# Patient Record
Sex: Female | Born: 1990 | Race: White | Hispanic: No | Marital: Married
Health system: Midwestern US, Academic
[De-identification: ages and names within clinical notes are randomized; demographics above are authoritative.]

---

## 2021-12-21 ENCOUNTER — Encounter: Admit: 2021-12-21 | Discharge: 2021-12-21 | Payer: BC Managed Care – PPO

## 2021-12-28 ENCOUNTER — Encounter: Admit: 2021-12-28 | Discharge: 2021-12-28 | Payer: BC Managed Care – PPO

## 2021-12-28 ENCOUNTER — Ambulatory Visit: Admit: 2021-12-28 | Discharge: 2021-12-29 | Payer: BC Managed Care – PPO

## 2021-12-28 DIAGNOSIS — G5601 Carpal tunnel syndrome, right upper limb: Secondary | ICD-10-CM

## 2021-12-28 DIAGNOSIS — M25531 Pain in right wrist: Secondary | ICD-10-CM

## 2021-12-28 DIAGNOSIS — E119 Type 2 diabetes mellitus without complications: Secondary | ICD-10-CM

## 2021-12-28 DIAGNOSIS — F32A Depression: Secondary | ICD-10-CM

## 2021-12-28 DIAGNOSIS — G5621 Lesion of ulnar nerve, right upper limb: Secondary | ICD-10-CM

## 2021-12-28 DIAGNOSIS — J189 Pneumonia, unspecified organism: Secondary | ICD-10-CM

## 2021-12-28 DIAGNOSIS — T7840XA Allergy, unspecified, initial encounter: Secondary | ICD-10-CM

## 2021-12-28 DIAGNOSIS — F419 Anxiety disorder, unspecified: Secondary | ICD-10-CM

## 2021-12-28 MED ORDER — TRIAMCINOLONE ACETONIDE 40 MG/ML IJ SUSP
40 mg | Freq: Once | INTRAMUSCULAR | 0 refills | Status: CP
Start: 2021-12-28 — End: ?
  Administered 2021-12-28: 14:00:00 40 mg via INTRAMUSCULAR

## 2021-12-28 MED ORDER — LIDOCAINE-EPINEPHRINE 1 %-1:100,000 IJ SOLN
1 mL | Freq: Once | 0 refills | Status: CP
Start: 2021-12-28 — End: ?
  Administered 2021-12-28: 14:00:00 1 mL

## 2021-12-28 NOTE — Progress Notes
Subjective:       History of Present Illness  Renee Christensen is a 31 y.o. female, right hand dominant, who presents for evaluation of right ulnar-sided wrist pain. She denies any specific injury but notes that it seems to be associated with repetitive movemetns.  She was initially seen and had X-rays, which showed no bony abnormality. She subsequently had an MRI of the wrist, which showed Small perforation of the SL ligament and the central component, mild thickening of the ECRB and ECRL, volar ganglion cyst. and was referred for evaluation and treatment.    She has not had a cortisone injection to her ulnar wrist. She has not had occupational therapy.    The pain is sharp especially with rotation of the wrist in supination and when she pushes down on the wrist. There is numbness or tingling of the entire hand sometimes the small and ring fingers.  She reports that sometimes the symptoms wake her from her sleep. She does not feel any gross instability of the wrist.     She denies prior injuries to that wrist or hand.  She reports she had surgery on her right wrist for a first dorsal compartment release which generally helped but has left her with some numbness over her dorsal thumb and a scar which when bumped causes significant pain.    Occupation: works at [employer]       Review of Systems   Constitutional: Negative.    HENT: Negative.    Eyes: Negative.    Respiratory: Negative.    Cardiovascular: Negative.    Gastrointestinal: Negative.    Endocrine: Negative.    Genitourinary: Negative.    Musculoskeletal: Negative.    Skin: Negative.    Allergic/Immunologic: Negative.    Neurological: Negative.    Hematological: Negative.    Psychiatric/Behavioral: Negative.          Objective:         ? atorvastatin (LIPITOR) 80 mg tablet Take one tablet by mouth.   ? cetirizine (ZYRTEC) 10 mg tablet Take one tablet by mouth.   ? citalopram (CELEXA) 20 mg tablet    ? insulin aspart U-100 (NOVOLOG) 100 unit/mL injection Insulin pump; Medtronic 670G: 12A-2.2,7a-2, 3- 2.20, 9p-2.00 I:C 12a-6.2,5a-6.2,11a-6.2,5p-6.2, 9p-6.2 SF 30 Target 120 AIT 2hrs TDD 75 U   ? lisinopril (ZESTRIL) 5 mg tablet Take one tablet by mouth.   ? metFORMIN (GLUCOPHAGE) 500 mg tablet Take one tablet by mouth.   ? SUMAtriptan succinate (IMITREX) 100 mg tablet      Vitals:    12/28/21 0850   BP: (!) 141/71   Pulse: 79   Temp: 36.4 ?C (97.6 ?F)   SpO2: 100%   TempSrc: Oral   PainSc: Six   Weight: 83.9 kg (185 lb)   Height: 162.6 cm (5' 4)     Body mass index is 31.76 kg/m?Marland Kitchen     Physical Exam  Constitutional: Well developed, well nourished, in no acute distress  Psychological: normal affect, mood   HEENT: normocephalic, anicteric  Neck: supple, normal ROM   Respiratory: Normal effort, no respiratory distress, no cyanosis  Cardiovascular: visible extremities are warm and well perfused   RIGHT UPPER EXTREMITY:   Skin: intact, normal color and temperature   Observation: minor edema of left dorsal wrist. No frank ECU subluxation, but clicking of the wrist that resolves when stabilizing the ECU.  Tenderness:    Snuffbox: Negative  on right, negative on left    Scapholunate interval:  Negative on right, negative on left    Lunotriquetral interval: Negative on right, negative on left    ECU: Positive on right, negative on left    DRUJ: Negative on right, negative on left    Fovea/TFCC: Positive on right, negative on left    Press test: Positive on right, negative on left  Vascular: 2+ radial pulses bilaterally. Hand and fingers are warm, well perfused with normal capillary refill.   Musculoskeletal: Forearm and hand compartments are soft, no pain with passive motion of the fingers.   WRIST Range of Motion within normal limits  Watson's Scaphoid Shift: negative on right, negative on left  LT Ballottement:negative on right, negative on left  Piano UXL:[id number] on right, negative on left  DRUJ: stable on right, stable on left  Table top press test: negative on right, negative on left    Sensory Exam:  Light touch was intact in both forearms and hands.  Two-point discrimination was normal, less than 5 mm, in the median and ulnar nerve distributions of both hands.      Provocative testing at the wrist revealed the following:      RIGHT  LEFT   Durkin's test  + +      Tinel's sign +    -          Motor Exam:  There was no evidence of weakness or atrophy of the extrinsic musculature or of the intrinsic muscles innervated by the ulnar nerve.    Examination of the thenar eminences revealed:      RIGHT  LEFT   Thenar atrophy  -    -      Thenar strength  5/5    5/5           Assessment and Plan:  Renee Christensen is a 31 y.o. 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    The patient's exam and symptoms are not perfectly consistent with TFCC injury and MRI shows no tear or TFCC injury.  Her symptoms seem to be more ECU related pain without frank subluxation.  She has no DRUJ instability.     Treatment options include splint or cast immobilization, occupational therapy, steroid injection, and arthroscopic or open surgery.      However, given that the patient's symptoms appear to be inflammation mediated prior to considering more invasive options we will try a steroid injection.  The patient is agreeable to this plan.   She should avoid sports and heavy lifting with the left wrist to allow for this to heal.  [name] will follow up in 1 month for interval exam and re-assessment.       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  While we wait to see if her ulnar-sided wrist pain needs surgical intervention we will manage conservatively with nighttime splinting, topical NSAIDs.  She would likely benefit in the future from surgical release.    Procedure: TFCC/ECU injection  Diagnosis: Ulnar-sided wrist pain    Consent obtained, timeout done, skin prepped with alcohol  1 mL 1% lidocaine infiltrated for local anesthesia  1 mL kenalog 40 injected into sheath  Patient tolerated the procedure well      Gracy Bruins., MD  12/28/21 11:15 AM

## 2022-02-05 ENCOUNTER — Encounter: Admit: 2022-02-05 | Discharge: 2022-02-05 | Payer: BC Managed Care – PPO

## 2022-02-05 ENCOUNTER — Ambulatory Visit: Admit: 2022-02-05 | Discharge: 2022-02-05 | Payer: BC Managed Care – PPO

## 2022-02-05 DIAGNOSIS — G5621 Lesion of ulnar nerve, right upper limb: Secondary | ICD-10-CM

## 2022-02-05 DIAGNOSIS — E119 Type 2 diabetes mellitus without complications: Secondary | ICD-10-CM

## 2022-02-05 DIAGNOSIS — G5601 Carpal tunnel syndrome, right upper limb: Secondary | ICD-10-CM

## 2022-02-05 DIAGNOSIS — J189 Pneumonia, unspecified organism: Secondary | ICD-10-CM

## 2022-02-05 DIAGNOSIS — G5631 Lesion of radial nerve, right upper limb: Secondary | ICD-10-CM

## 2022-02-05 DIAGNOSIS — T7840XA Allergy, unspecified, initial encounter: Secondary | ICD-10-CM

## 2022-02-05 DIAGNOSIS — F419 Anxiety disorder, unspecified: Secondary | ICD-10-CM

## 2022-02-05 DIAGNOSIS — F32A Depression: Secondary | ICD-10-CM

## 2022-02-05 NOTE — Progress Notes
Subjective:       History of Present Illness  Renee Christensen is a 31 y.o. female, right hand dominant, who presents for evaluation of right ulnar-sided wrist pain. She denies any specific injury but notes that it seems to be associated with repetitive movements.  She was initially seen and had X-rays, which showed no bony abnormality. She subsequently had an MRI of the wrist, which showed Small perforation of the SL ligament and the central component, mild thickening of the ECRB and ECRL, volar ganglion cyst. and was referred for evaluation and treatment.    On my last visit she had inflammation and pain around her ECU but no subluxation of her ECU.  We treated her with a steroid injection to very good effect.  She reports this is improved for pain over her ulnar wrist.  She still reports feeling of hand cramping and locking up as well as numbness and tingling of her entire hand which sometimes wakes her up.  She does not feel any gross instability of the wrist.     She denies prior injuries to that wrist or hand.  She reports she had surgery on her right wrist for a first dorsal compartment release which generally helped but has left her with some numbness over her dorsal thumb and a scar which when bumped causes significant pain.    Occupation: Production designer, theatre/television/film at [employer]       Review of Systems   Constitutional: Negative.    HENT: Negative.    Eyes: Negative.    Respiratory: Negative.    Cardiovascular: Negative.    Gastrointestinal: Negative.    Endocrine: Negative.    Genitourinary: Negative.    Musculoskeletal: Negative.    Skin: Negative.    Allergic/Immunologic: Negative.    Neurological: Negative.    Hematological: Negative.    Psychiatric/Behavioral: Negative.          Objective:         ? atorvastatin (LIPITOR) 80 mg tablet Take one tablet by mouth.   ? cetirizine (ZYRTEC) 10 mg tablet Take one tablet by mouth.   ? citalopram (CELEXA) 20 mg tablet    ? insulin aspart U-100 (NOVOLOG) 100 unit/mL injection Insulin pump; Medtronic 670G: 12A-2.2,7a-2, 3- 2.20, 9p-2.00 I:C 12a-6.2,5a-6.2,11a-6.2,5p-6.2, 9p-6.2 SF 30 Target 120 AIT 2hrs TDD 75 U   ? lisinopril (ZESTRIL) 5 mg tablet Take one tablet by mouth.   ? metFORMIN (GLUCOPHAGE) 500 mg tablet Take one tablet by mouth.   ? SUMAtriptan succinate (IMITREX) 100 mg tablet      Vitals:    02/05/22 0857   BP: 130/69   Pulse: 88   Temp: 36.6 ?C (97.9 ?F)   SpO2: 99%   TempSrc: Skin   PainSc: Three   Weight: 81.4 kg (179 lb 6.4 oz)   Height: 162.6 cm (5' 4)     Body mass index is 30.79 kg/m?Marland Kitchen     Physical Exam  Constitutional: Well developed, well nourished, in no acute distress  Psychological: normal affect, mood   HEENT: normocephalic, anicteric  Neck: supple, normal ROM   Respiratory: Normal effort, no respiratory distress, no cyanosis  Cardiovascular: visible extremities are warm and well perfused   RIGHT UPPER EXTREMITY:   Skin: intact, normal color and temperature   Observation: minor edema of left dorsal wrist. No frank ECU subluxation, but clicking of the wrist that resolves when stabilizing the ECU.  Tenderness:    Snuffbox: Negative  on right, negative on left  Scapholunate interval: Negative on right, negative on left    Lunotriquetral interval: Negative on right, negative on left    ECU: Negative on right, negative on left    DRUJ: Negative on right, negative on left    Fovea/TFCC: Negative on right, negative on left    Press test: Positive on right, negative on left  Vascular: 2+ radial pulses bilaterally. Hand and fingers are warm, well perfused with normal capillary refill.   Musculoskeletal: Forearm and hand compartments are soft, no pain with passive motion of the fingers.   WRIST Range of Motion within normal limits  Watson's Scaphoid Shift: negative on right, negative on left  LT Ballottement:negative on right, negative on left  Piano ZYS:[id number] on right, negative on left  DRUJ: stable on right, stable on left  Table top press test: negative on right, negative on left    Sensory Exam:  Light touch was intact in both forearms and hands.  Two-point discrimination was normal, less than 5 mm, in the median and ulnar nerve distributions of both hands.      Provocative testing at the wrist revealed the following:      RIGHT  LEFT   Durkin's test  + +      Tinel's sign +    -          Motor Exam:  There was no evidence of weakness or atrophy of the extrinsic musculature or of the intrinsic muscles innervated by the ulnar nerve.    Examination of the thenar eminences revealed:      RIGHT  LEFT   Thenar atrophy  -    -      Thenar strength  5/5    5/5           Assessment and Plan:  Renee Christensen is a 31 y.o. 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    The patient's exam and symptoms are not perfectly consistent with TFCC injury and MRI shows no tear or TFCC injury.  Her symptoms seem to be more ECU related pain without frank subluxation.  She has no DRUJ instability.     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 She would benefit from surgical release.  In regards to the tenderness, Tinel's and pain over her dorsal scar near the first dorsal compartment we discussed possible neuroma excision at our last visit.  If the patient would desire exploration of this wound, neurolysis of the radial sensory branch possible neuroma excision this could be done at the same time.  We discussed the risks of surgery which include but are not limited to bleeding, infection, pain, changes in sensibility, nerve damage, RSD.  The patient understands the risks would like to proceed.    Operation: Carpal tunnel release, cubital tunnel release, possible neurolysis versus neuroma excision radial sensory nerve.  Codes: 09323, [id number], possible [id number], possible [phone]  Anesthetic: Regional  Surgical time: 2.0 hours  Position: supine  Special Equipment/Implants: micro instruments on standby  Post op visit: 2 weeks  Patient Class: outpatient          Gracy Bruins., MD  02/05/22 9:19 AM

## 2022-02-07 ENCOUNTER — Ambulatory Visit: Admit: 2022-02-07 | Discharge: 2022-02-07 | Payer: BC Managed Care – PPO

## 2022-02-07 ENCOUNTER — Encounter: Admit: 2022-02-07 | Discharge: 2022-02-07 | Payer: BC Managed Care – PPO

## 2022-02-07 DIAGNOSIS — G5601 Carpal tunnel syndrome, right upper limb: Secondary | ICD-10-CM

## 2022-02-07 DIAGNOSIS — G5631 Lesion of radial nerve, right upper limb: Secondary | ICD-10-CM

## 2022-02-13 ENCOUNTER — Encounter: Admit: 2022-02-13 | Discharge: 2022-02-13 | Payer: BC Managed Care – PPO

## 2022-03-05 ENCOUNTER — Encounter: Admit: 2022-03-05 | Discharge: 2022-03-05 | Payer: BC Managed Care – PPO

## 2022-03-05 ENCOUNTER — Ambulatory Visit: Admit: 2022-03-05 | Discharge: 2022-03-05 | Payer: BC Managed Care – PPO

## 2022-03-05 DIAGNOSIS — T7840XA Allergy, unspecified, initial encounter: Secondary | ICD-10-CM

## 2022-03-05 DIAGNOSIS — J189 Pneumonia, unspecified organism: Secondary | ICD-10-CM

## 2022-03-05 DIAGNOSIS — F32A Depression: Secondary | ICD-10-CM

## 2022-03-05 DIAGNOSIS — F419 Anxiety disorder, unspecified: Secondary | ICD-10-CM

## 2022-03-05 DIAGNOSIS — E119 Type 2 diabetes mellitus without complications: Secondary | ICD-10-CM

## 2022-03-05 DIAGNOSIS — Z01818 Encounter for other preprocedural examination: Secondary | ICD-10-CM

## 2022-03-05 LAB — BASIC METABOLIC PANEL
ANION GAP: 10 pg (ref 3–12)
BLD UREA NITROGEN: 16 mg/dL (ref 7–25)
CALCIUM: 8.7 mg/dL (ref 8.5–10.6)
CHLORIDE: 103 MMOL/L (ref 98–110)
CO2: 22 MMOL/L (ref 21–30)
CREATININE: 0.6 mg/dL (ref 0.4–1.00)
EGFR: >60 mL/min (ref >60)
GLUCOSE,PANEL: 193 mg/dL — ABNORMAL HIGH (ref 70–100)
POTASSIUM: 3.4 MMOL/L — ABNORMAL LOW (ref 3.5–5.1)
SODIUM: 135 MMOL/L — ABNORMAL LOW (ref 137–147)

## 2022-03-05 LAB — CBC: WBC COUNT: 11 K/UL — ABNORMAL HIGH (ref 4.5–11.0)

## 2022-03-05 NOTE — Pre-Anesthesia Patient Instructions
PREPROCEDURE INFORMATION    Arrival at the hospital  [facility]  [address]  [place], [place] [address]    Park in the [employer], located directly across from the main entrance to the hospital.  Enter through the ground floor main hospital entrance and check in at the Information Desk in the lobby.  They will validate your parking ticket and direct you to the next location.  If you are a woman between the ages of 32 and 12, and have not had a hysterectomy, you will be asked for a urine sample prior to surgery.  Please do not urinate before arriving in the Surgery Waiting Room.  Once there, check in and let the attendant know if you need to provide a sample.    You will receive a call with your surgery arrival time between 2:30pm and 4:30pm the last business day before your procedure.  If you do not receive a call, please call [phone] before 4:30pm or [phone] after 4:30pm.    Eating or drinking before surgery  Do not eat or drink anything after 11:00 p.m. the day before your procedure (including gum, mints, candy, or chewing tobacco) OR follow the specific instructions you were given by your Surgeon.  You may have WATER ONLY up to 2 hours before arriving at the hospital.    Planning transportation for outpatient procedure  For your safety, you will need to arrange for a responsible ride/person to accompany you home due to sedation or anesthesia with your procedure.  An [facility], taxi or other public transportation driver is not considered a responsible person to accompany you home.    Bath/Shower Instructions  Take a bath or shower using the special soap given to you in PAC. Use half the bottle the night before, and the other half the morning of your procedure. Use clean towels with each bath or shower.  Put on clean clothes after bath or shower.  Avoid using lotion and oils.  If you are having surgery above the waist, wear a shirt that fastens up the front.  Sleep on clean sheets if bath or shower is done the night before procedure.    Morning of your procedure:  Brush your teeth and tongue  Do not smoke, vape, chew or user any tobacco products.  Do not shave the area where you will have surgery.  Remove nail polish, makeup and all jewelry (including piercings) before coming to the hospital.  Dress in clean, loose, comfortable clothing.    Valuables  Leave money, credit cards, jewelry, and any other valuables at home. The [facility] is not responsible for the loss or breakage of personal items.    What to bring to the hospital  ID/Insurance card  Medical Device card  Official documents for legal guardianship  Copy of your Living Will, Advanced Directives, and/or Durable Power of Attorney. If you have these documents, please bring them to the admissions office on the day of your surgery to be scanned into your records.  Do not bring medications from home unless instructed by a pharmacist.  CPAP/BiPAP machine (including all supplies)  Walker, cane, or motorized scooter  Cases for glasses/hearing aids/contact lens (bring solutions for contacts)     Notify us at [facility]: [phone] on the day of your procedure if:  You need to cancel your procedure.  You are going to be late.    Notify your surgeon if:  There is a possibility  that you are pregnant.  You become ill with a cough, fever, sore throat, nausea, vomiting or flu-like symptoms.  You have any open wounds/sores that are red, painful, draining, or are new since you last saw the doctor.  You need to cancel your procedure.    Preparing to get your medications at discharge  Your surgeon may prescribe you medications to take after your procedure.  If you like the convenience of having your medications filled here at [place], please do the following:  Go to [place] pharmacy after your [facility] appointment to put a credit card on file.  Call [place] pharmacy at [phone] (Monday-Friday 7am-9pm or Saturday and Sunday 9am-5pm) to put a credit card on file.  Bring a credit card or cash on the day of your procedure- please leave with a family member rather than bringing it into the preop area.    Current Visitor Policy:  Visitors must be free of fever and symptoms to be in our facilities.  No more than 2 visitors per patient are allowed.  Additional guidelines may vary, based on patient care area or patient's condition.  Patients in semiprivate rooms may have visitors, but visits should be coordinated so only two total visitors are in a room at a time due to space limitations.  Children younger than age 32 are allowed to visit inpatients.    Thank you for participating in your Preoperative Assessment Clinic visit today.

## 2022-03-05 NOTE — Progress Notes
Preoperative Medication Plan Note:    Renee Christensen was seen in the [facility] on 03/05/22.  As part of the visit, an accurate medication list was obtained and the patient was given pre-op medication instructions for upcoming surgery on 03/08/22 with Dr. Allyne Gee.      Insulin pump Plan: Per [name] PA-C (PCP), the preoperative insulin plan is as follows:    Wear insulin pump in auto mode: automatically adjusts basal insulin every five minutes based on the patients continuous glucose monitoring (CGM) readings"     The plan above was communicated to the patient in [facility] visit and they verbalized understanding.    [name], PHARMD

## 2022-03-07 ENCOUNTER — Encounter: Admit: 2022-03-07 | Discharge: 2022-03-07 | Payer: BC Managed Care – PPO

## 2022-03-08 ENCOUNTER — Encounter: Admit: 2022-03-08 | Discharge: 2022-03-08 | Payer: BC Managed Care – PPO

## 2022-03-08 ENCOUNTER — Ambulatory Visit: Admit: 2022-03-08 | Discharge: 2022-03-08 | Payer: BC Managed Care – PPO

## 2022-03-08 DIAGNOSIS — F419 Anxiety disorder, unspecified: Secondary | ICD-10-CM

## 2022-03-08 DIAGNOSIS — F32A Depression: Secondary | ICD-10-CM

## 2022-03-08 DIAGNOSIS — T7840XA Allergy, unspecified, initial encounter: Secondary | ICD-10-CM

## 2022-03-08 DIAGNOSIS — E119 Type 2 diabetes mellitus without complications: Secondary | ICD-10-CM

## 2022-03-08 DIAGNOSIS — J189 Pneumonia, unspecified organism: Secondary | ICD-10-CM

## 2022-03-08 MED ORDER — LIDOCAINE (PF) 10 MG/ML (1 %) IJ SOLN
SUBCUTANEOUS | 0 refills | Status: CP
Start: 2022-03-08 — End: ?
  Administered 2022-03-08: 12:00:00 2 mL via SUBCUTANEOUS

## 2022-03-08 MED ORDER — LIDOCAINE (PF) 20 MG/ML (2 %) IJ SOLN
0 refills | Status: CP
Start: 2022-03-08 — End: ?
  Administered 2022-03-08: 12:00:00 10 mL

## 2022-03-08 MED ORDER — FENTANYL CITRATE (PF) 50 MCG/ML IJ SOLN
INTRAVENOUS | 0 refills | Status: DC
Start: 2022-03-08 — End: 2022-03-08
  Administered 2022-03-08: 13:00:00 50 ug via INTRAVENOUS

## 2022-03-08 MED ORDER — CEFAZOLIN 1 GRAM IJ SOLR
INTRAVENOUS | 0 refills | Status: DC
Start: 2022-03-08 — End: 2022-03-08
  Administered 2022-03-08: 13:00:00 2 g via INTRAVENOUS

## 2022-03-08 MED ORDER — BUPIVACAINE HCL 0.5 % (5 MG/ML) IJ SOLN
0 refills | Status: CP
Start: 2022-03-08 — End: ?
  Administered 2022-03-08: 12:00:00 10 mL

## 2022-03-08 MED ORDER — MIDAZOLAM 1 MG/ML IJ SOLN
INTRAVENOUS | 0 refills | Status: CP
Start: 2022-03-08 — End: ?
  Administered 2022-03-08: 12:00:00 2 mg via INTRAVENOUS

## 2022-03-08 MED ORDER — PROPOFOL 10 MG/ML IV EMUL 20 ML (INFUSION)(AM)(OR)
INTRAVENOUS | 0 refills | Status: DC
Start: 2022-03-08 — End: 2022-03-08
  Administered 2022-03-08: 13:00:00 125 ug/kg/min via INTRAVENOUS

## 2022-03-08 MED ORDER — PROPOFOL INJ 10 MG/ML IV VIAL
INTRAVENOUS | 0 refills | Status: DC
Start: 2022-03-08 — End: 2022-03-08
  Administered 2022-03-08: 13:00:00 50 mg via INTRAVENOUS

## 2022-03-08 MED ADMIN — LACTATED RINGERS IV SOLP [4318]: 1000 mL | INTRAVENOUS | @ 12:00:00 | Stop: 2022-03-08 | NDC 00338011704

## 2022-03-10 ENCOUNTER — Encounter: Admit: 2022-03-10 | Discharge: 2022-03-10 | Payer: BC Managed Care – PPO

## 2022-03-10 DIAGNOSIS — T7840XA Allergy, unspecified, initial encounter: Secondary | ICD-10-CM

## 2022-03-10 DIAGNOSIS — F32A Depression: Secondary | ICD-10-CM

## 2022-03-10 DIAGNOSIS — J189 Pneumonia, unspecified organism: Secondary | ICD-10-CM

## 2022-03-10 DIAGNOSIS — E119 Type 2 diabetes mellitus without complications: Secondary | ICD-10-CM

## 2022-03-10 DIAGNOSIS — F419 Anxiety disorder, unspecified: Secondary | ICD-10-CM

## 2022-03-14 ENCOUNTER — Encounter: Admit: 2022-03-14 | Discharge: 2022-03-14 | Payer: BC Managed Care – PPO

## 2022-03-14 MED ORDER — FUROSEMIDE 20 MG PO TAB
ORAL_TABLET | ORAL | 0 refills | 90.00000 days | Status: AC
Start: 2022-03-14 — End: ?

## 2022-03-14 NOTE — Telephone Encounter
Patient called regarding lower extremity swelling since surgery.    Endorses foot and ankle swelling with worsening swelling as the day/work goes.     Has happened previously with surgery and required a Lasix burst to resolve.     This nurse spoke with Dr. Lehman Prom regarding the patient concern. Verbal order for Lasix 40mg  for 2 days and 20mg s for 2 days confirmed.     Patient notified.

## 2022-03-21 ENCOUNTER — Encounter: Admit: 2022-03-21 | Discharge: 2022-03-21 | Payer: BC Managed Care – PPO

## 2022-03-21 ENCOUNTER — Ambulatory Visit: Admit: 2022-03-21 | Discharge: 2022-03-22 | Payer: BC Managed Care – PPO

## 2022-03-21 DIAGNOSIS — G5601 Carpal tunnel syndrome, right upper limb: Secondary | ICD-10-CM

## 2022-03-21 DIAGNOSIS — J189 Pneumonia, unspecified organism: Secondary | ICD-10-CM

## 2022-03-21 DIAGNOSIS — E119 Type 2 diabetes mellitus without complications: Secondary | ICD-10-CM

## 2022-03-21 DIAGNOSIS — G5631 Lesion of radial nerve, right upper limb: Secondary | ICD-10-CM

## 2022-03-21 DIAGNOSIS — F32A Depression: Secondary | ICD-10-CM

## 2022-03-21 DIAGNOSIS — T7840XA Allergy, unspecified, initial encounter: Secondary | ICD-10-CM

## 2022-03-21 DIAGNOSIS — G5621 Lesion of ulnar nerve, right upper limb: Secondary | ICD-10-CM

## 2022-03-21 DIAGNOSIS — F419 Anxiety disorder, unspecified: Secondary | ICD-10-CM

## 2022-03-21 MED ORDER — TRAMADOL 50 MG PO TAB
50 mg | ORAL_TABLET | ORAL | 0 refills | Status: AC | PRN
Start: 2022-03-21 — End: ?

## 2022-03-21 NOTE — Progress Notes
Subjective:       History of Present Illness  Renee Christensen is a 31 y.o. female presenting for her first post op appointment after carpal, cubital, guyon release and neurolysis of her radial sensory nerve with wrap. Her elbow is doing well without pain, she reports significant improvement in the numbness and tingling in her small finger especially but improvements in the whole hand, but residual pain, firmness and symptoms in her dorsal wrist.     Review of Systems   Constitutional: Negative.    HENT: Negative.    Eyes: Negative.    Respiratory: Negative.    Cardiovascular: Negative.    Gastrointestinal: Negative.    Endocrine: Negative.    Genitourinary: Negative.    Musculoskeletal: Negative.    Skin: Negative.    Allergic/Immunologic: Negative.    Neurological: Negative.    Hematological: Negative.    Psychiatric/Behavioral: Negative.          Objective:         ? atorvastatin (LIPITOR) 80 mg tablet Take one tablet by mouth daily.   ? cetirizine (ZYRTEC) 10 mg tablet Take one tablet by mouth every morning.   ? citalopram (CELEXA) 20 mg tablet Take one tablet by mouth daily.   ? furosemide (LASIX) 20 mg tablet Please take 2 tablets daily for 2 days and then 1 tablets daily for 2 days.   ? gabapentin (NEURONTIN) 100 mg capsule Take one capsule by mouth three times daily.   ? insulin aspart (U-100) (NOVOLOG FLEXPEN U-100 INSULIN) 100 unit/mL (3 mL) PEN Inject  under the skin three times daily with meals. Uses per sliding scale if needed for pump failure   ? insulin aspart U-100 (NOVOLOG) 100 unit/mL injection Insulin pump; Medtronic 670G: 12A-2.2,7a-2, 3- 2.20, 9p-2.00 I:C 12a-6.2,5a-6.2,11a-6.2,5p-6.2, 9p-6.2 SF 30 Target 120 AIT 2hrs TDD 75 U   ? insulin glargine (LANTUS SOLOSTAR U-100 INSULIN) 100 unit/mL (3 mL) subcutaneous PEN Inject twenty five Units under the skin at bedtime as needed (pump failure).   ? lisinopril (ZESTRIL) 5 mg tablet Take one tablet by mouth daily.   ? metFORMIN (GLUCOPHAGE) 500 mg tablet Take one tablet by mouth twice daily with meals.   ? SUMAtriptan succinate (IMITREX) 100 mg tablet Take one tablet by mouth as Needed.     Vitals:    03/21/22 1552   BP: 124/65   Pulse: 95   PainSc: Eight   Weight: 86.2 kg (190 lb 1.6 oz)   Height: 162.6 cm (5' 4)     Body mass index is 32.63 kg/m?Marland Kitchen     Physical Exam  alert, oriented  Breathing unlabored  RR  RUE:  INcisions in the elbow, wrist dorsally and volarly well approximated without surrounding erythema or drainage. Firm scar on dorsum of wrist       Assessment and Plan:  Renee Christensen is a 31 y.o. female s/p carpal, cubital and guyons release as well as radial nerve neurolysis and wrap overall doing well.  -We discussed the natural history of nerve injuries and nerve surgery. Specifically that it takes many months for an irritated nerve to heal.Her firm scar also represents normal healing at this stage and I encouraged massage.  -She will use her extremity as tolerated without restrictions and resume her normal activities of daily living  -RTC in 1 month or sooner if needed.

## 2022-04-23 ENCOUNTER — Encounter: Admit: 2022-04-23 | Discharge: 2022-04-23 | Payer: BC Managed Care – PPO

## 2022-04-23 ENCOUNTER — Ambulatory Visit: Admit: 2022-04-23 | Discharge: 2022-04-23 | Payer: BC Managed Care – PPO

## 2022-04-23 DIAGNOSIS — F32A Depression: Secondary | ICD-10-CM

## 2022-04-23 DIAGNOSIS — Z9889 Other specified postprocedural states: Secondary | ICD-10-CM

## 2022-04-23 DIAGNOSIS — G5602 Carpal tunnel syndrome, left upper limb: Secondary | ICD-10-CM

## 2022-04-23 DIAGNOSIS — T7840XA Allergy, unspecified, initial encounter: Secondary | ICD-10-CM

## 2022-04-23 DIAGNOSIS — J189 Pneumonia, unspecified organism: Secondary | ICD-10-CM

## 2022-04-23 DIAGNOSIS — G5622 Lesion of ulnar nerve, left upper limb: Secondary | ICD-10-CM

## 2022-04-23 DIAGNOSIS — F419 Anxiety disorder, unspecified: Secondary | ICD-10-CM

## 2022-04-23 DIAGNOSIS — E119 Type 2 diabetes mellitus without complications: Secondary | ICD-10-CM

## 2022-04-23 NOTE — Progress Notes
Subjective:       History of Present Illness  Renee Christensen is a 31 y.o. female presenting for her first post op appointment after carpal, cubital, guyon release and neurolysis of her radial sensory nerve with wrap.  Since I saw her last time she has been doing well and getting back to her normal activities of daily living, she had 2 small areas on her right elbow incision where Monocryl sutures had developed suture abscesses and came out on their own.  Otherwise, the patient reports the sensitivity over the radial nerve has been steadily improving.    She is having profound numbness and tingling in her small finger on the left side as well as numbness and tingling of the radial 3 digits which is episodic and wake her from her sleep.  She is interested in having similar procedure done on the left side.     Review of Systems   Constitutional: Negative.    HENT: Negative.    Eyes: Negative.    Respiratory: Negative.    Cardiovascular: Negative.    Gastrointestinal: Negative.    Endocrine: Negative.    Genitourinary: Negative.    Musculoskeletal: Negative.    Skin: Negative.    Allergic/Immunologic: Negative.    Neurological: Negative.    Hematological: Negative.    Psychiatric/Behavioral: Negative.          Objective:         ? atorvastatin (LIPITOR) 80 mg tablet Take one tablet by mouth daily.   ? cetirizine (ZYRTEC) 10 mg tablet Take one tablet by mouth every morning.   ? citalopram (CELEXA) 20 mg tablet Take one tablet by mouth daily.   ? furosemide (LASIX) 20 mg tablet Please take 2 tablets daily for 2 days and then 1 tablets daily for 2 days.   ? gabapentin (NEURONTIN) 100 mg capsule Take one capsule by mouth three times daily.   ? insulin aspart (U-100) (NOVOLOG FLEXPEN U-100 INSULIN) 100 unit/mL (3 mL) PEN Inject  under the skin three times daily with meals. Uses per sliding scale if needed for pump failure   ? insulin aspart U-100 (NOVOLOG) 100 unit/mL injection Insulin pump; Medtronic 670G: 12A-2.2,7a-2, 3- 2.20, 9p-2.00 I:C 12a-6.2,5a-6.2,11a-6.2,5p-6.2, 9p-6.2 SF 30 Target 120 AIT 2hrs TDD 75 U   ? insulin glargine (LANTUS SOLOSTAR U-100 INSULIN) 100 unit/mL (3 mL) subcutaneous PEN Inject twenty five Units under the skin at bedtime as needed (pump failure).   ? lisinopril (ZESTRIL) 5 mg tablet Take one tablet by mouth daily.   ? metFORMIN (GLUCOPHAGE) 500 mg tablet Take one tablet by mouth twice daily with meals.   ? SUMAtriptan succinate (IMITREX) 100 mg tablet Take one tablet by mouth as Needed.     Vitals:    04/23/22 0918   BP: (!) 147/67   Pulse: 72   Temp: 36.6 ?C (97.9 ?F)   SpO2: 100%   TempSrc: Oral   PainSc: Zero   Weight: 86.2 kg (190 lb)   Height: 162.6 cm (5' 4)     Body mass index is 32.61 kg/m?Marland Kitchen     Physical Exam  alert, oriented  Breathing unlabored  RR  RUE:  INcisions in the elbow, wrist dorsally and volarly well approximated without surrounding erythema or drainage.  Scar on the dorsum of her right wrist has begun to soften and swelling is mild.  Constitutional: Well developed, well nourished, in no acute distress  Psychological: normal affect, mood    HEENT: normocephalic, atraumatic, anicteric  Neck: supple, midline trachea, normal ROM   Respiratory: Normal effort, no respiratory distress, no cyanosis  Cardiovascular: visible extremities are warm and well perfused      Left UPPER EXTREMITY   Observation: Normal appearance without scar  Neurovascular: Normal sensibility to light touch in the ulnar, radial and median nerve distributions, fingers well perfused with normal capillary refill. Bilateral hand and wrist range of motion, muscle tone, muscle symmetry and coordination are normal   Swelling/effusions: No swelling of the extremity. Forearm and hand compartments soft, no pain with passive motion of the fingers.  Skin: intact, normal color and temperature    Sensory Exam:  Light touch was intact in both forearms and hands.  Two-point discrimination was normal, less than 5 mm, in the median and ulnar nerve distributions of both hands.      Provocative testing at the wrist revealed the following:      RIGHT  LEFT   Durkin's test   negative  positive      Tinel's sign  negative    Profoundly positive          Motor Exam:  There was no evidence of weakness or atrophy of the extrinsic musculature or of the intrinsic muscles innervated by the ulnar nerve.    Examination of the thenar eminences revealed:      RIGHT  LEFT   Thenar atrophy   negative    Negative      Thenar strength   5/5    4+/5                 Assessment and Plan:  Renee Christensen is a 31 y.o. female s/p carpal, cubital and guyons release as well as radial nerve neurolysis and wrap overall doing well.  -She is going to continue doing scar massage on the right side.    In regards to the left side, patient has very profound Tinel sign over her elbow over the ulnar nerve and Guyon's in the ulnar nerve distribution as well as signs and symptoms of carpal tunnel.  For this reason and because the patient has experienced such improvement in her symptoms on the right side we discussed carpal, cubital, Guyon's release on the left.  We discussed the risks of surgery which include but are not limited to bleeding, infection, damage to surrounding structures including nerves, recurrence, incomplete resolution of symptoms, CRPS.  The patient understands risks and like to proceed.    Operation: Left carpal tunnel release, left Guyon's release, left cubital tunnel release,  Codes: [address], [id number], [phone]  Anesthetic: Block, regional  Surgical time: 1 hour 15 minutes   Position: Supine on stretcher  Special Equipment/Implants: None  Post op visit: 2 weeks  Patient Class: Outpatient

## 2022-04-25 ENCOUNTER — Ambulatory Visit: Admit: 2022-04-25 | Discharge: 2022-04-25 | Payer: BC Managed Care – PPO

## 2022-04-25 ENCOUNTER — Encounter: Admit: 2022-04-25 | Discharge: 2022-04-25 | Payer: BC Managed Care – PPO

## 2022-04-25 DIAGNOSIS — G5622 Lesion of ulnar nerve, left upper limb: Secondary | ICD-10-CM

## 2022-04-25 DIAGNOSIS — G5602 Carpal tunnel syndrome, left upper limb: Secondary | ICD-10-CM

## 2022-04-25 NOTE — Unmapped
[facility] Anesthesia Pre-Procedure Evaluation    Name: Renee Christensen      MRN: [id number]     DOB: 1991-07-15     Age: 31 y.o.     Sex: female   _________________________________________________________________________     Procedure Info:   Procedure Information     Date/Time: 05/16/22 1120    Procedure: NEUROPLASTY/ TRANSPOSITION ULNAR NERVE AT ELBOW (Left) - 1.25 hrs, Anesthesia: Negional and nerve block     Left carpal tunnel release, left Guyon's release, left cubital tunnel release    Location: MAIN OR 12 / Main OR/Periop    Surgeons: Gracy Bruins., MD          Physical Assessment  Vital Signs (last filed in past 24 hours):         Patient History   Allergies   Allergen Reactions   ? Latex HIVES, RASH and ITCHING     hot        Current Medications    Medication Directions   atorvastatin (LIPITOR) 80 mg tablet Take one tablet by mouth daily.   cetirizine (ZYRTEC) 10 mg tablet Take one tablet by mouth every morning.   citalopram (CELEXA) 20 mg tablet Take one tablet by mouth daily.   furosemide (LASIX) 20 mg tablet Please take 2 tablets daily for 2 days and then 1 tablets daily for 2 days.   gabapentin (NEURONTIN) 100 mg capsule Take one capsule by mouth three times daily.   insulin aspart (U-100) (NOVOLOG FLEXPEN U-100 INSULIN) 100 unit/mL (3 mL) PEN Inject  under the skin three times daily with meals. Uses per sliding scale if needed for pump failure   insulin aspart U-100 (NOVOLOG) 100 unit/mL injection Insulin pump; Medtronic 670G: 12A-2.2,7a-2, 3- 2.20, 9p-2.00 I:C 12a-6.2,5a-6.2,11a-6.2,5p-6.2, 9p-6.2 SF 30 Target 120 AIT 2hrs TDD 75 U   insulin glargine (LANTUS SOLOSTAR U-100 INSULIN) 100 unit/mL (3 mL) subcutaneous PEN Inject twenty five Units under the skin at bedtime as needed (pump failure).   lisinopril (ZESTRIL) 5 mg tablet Take one tablet by mouth daily.   metFORMIN (GLUCOPHAGE) 500 mg tablet Take one tablet by mouth twice daily with meals.   SUMAtriptan succinate (IMITREX) 100 mg tablet Take one tablet by mouth as Needed.       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                            Scattered insect bites right arm; various stages of healing; mild erythema no active drainage    Constitution - negative   PHYSICAL EXAM     Diagnostic Tests  Hematology:   Lab Results   Component Value Date    HGB 14.5 03/05/2022    HCT 43.6 03/05/2022    PLTCT 282 03/05/2022    WBC 11.4 03/05/2022    MCV 88.1 03/05/2022    MCH 29.3 03/05/2022    MCHC 33.3 03/05/2022    MPV 9.7 03/05/2022    RDW 13.6 03/05/2022         General Chemistry:   Lab Results   Component Value Date    NA 135 03/05/2022    K 3.4 03/05/2022    CL 103 03/05/2022    CO2 22 03/05/2022    GAP 10 03/05/2022    BUN 16 03/05/2022    CR 0.68 03/05/2022    GLU 193 03/05/2022    CA 8.7 03/05/2022      Coagulation: No results found for: PT, PTT, INR    PAC Plan    Interview: Phone Screen Interview                                        Alerts

## 2022-04-25 NOTE — Pre-Anesthesia Patient Instructions
PREPROCEDURE INFORMATION    Arrival at the hospital  [facility]  [address]  [place], [place] [address]    Park in the [employer], located directly across from the main entrance to the hospital.  Enter through the ground floor main hospital entrance and check in at the Information Desk in the lobby.  They will validate your parking ticket and direct you to the next location.  If you are a woman between the ages of 66 and 58, and have not had a hysterectomy, you will be asked for a urine sample prior to surgery.  Please do not urinate before arriving in the Surgery Waiting Room.  Once there, check in and let the attendant know if you need to provide a sample.  Phone carriers that use spam blockers will sometimes block our phone numbers. If your phone contact number is a mobile phone, please adjust your settings to make sure you receive our call.  In your phone settings, turn OFF the setting ?silence unknown callers.?  Please add these phone numbers to your contacts [phone] & [phone]     You will receive a call with your surgery arrival time between 2:30pm and 4:30pm the last business day before your procedure.  If you do not receive a call, please call [phone] before 4:30pm or [phone] after 4:30pm.    Eating or drinking before surgery  Do not eat or drink anything after 11:00 p.m. the day before your procedure (including gum, mints, candy, or chewing tobacco) OR follow the specific instructions you were given by your Surgeon.  You may have WATER ONLY up to 2 hours before arriving at the hospital.    Planning transportation for outpatient procedure  For your safety, you will need to arrange for a responsible ride/person to accompany you home due to sedation or anesthesia with your procedure.  An [employer], taxi or other public transportation driver is not considered a responsible person to accompany you home.    Bath/Shower Instructions  Take a bath or shower with antibacterial soap the night before and the morning of your procedure. Use clean towels.  Put on clean clothes after bath or shower.  Avoid using lotion and oils.  If you are having surgery above the waist, wear a shirt that fastens up the front.  Sleep on clean sheets if bath or shower is done the night before procedure.    Morning of your procedure:  Brush your teeth and tongue  Do not smoke, vape, chew or user any tobacco products.  Do not shave the area where you will have surgery.  Remove nail polish, makeup and all jewelry (including piercings) before coming to the hospital.  Dress in clean, loose, comfortable clothing.    Valuables  Leave money, credit cards, jewelry, and any other valuables at home. The [facility] is not responsible for the loss or breakage of personal items.    What to bring to the hospital  ID/Insurance card  Medical Device card  Official documents for legal guardianship  Copy of your Living Will, Advanced Directives, and/or Durable Power of Attorney. If you have these documents, please bring them to the admissions office on the day of your surgery to be scanned into your records.  Do not bring medications from home unless instructed by a pharmacist.  Cases for glasses/hearing aids/contact lens (bring solutions for contacts)  Insulin pump and supplies as directed by pharmacy     Notify us at  [facility]: [phone] on the day of your procedure if:  You need to cancel your procedure.  You are going to be late.    Notify your surgeon if:  There is a possibility that you are pregnant.  You become ill with a cough, fever, sore throat, nausea, vomiting or flu-like symptoms.  You have any open wounds/sores that are red, painful, draining, or are new since you last saw the doctor.  You need to cancel your procedure.    Preparing to get your medications at discharge  Your surgeon may prescribe you medications to take after your procedure.  If you like the convenience of having your medications filled here at [place], please do the following:  Go to [place] pharmacy after your [facility] appointment to put a credit card on file.  Call [place] pharmacy at [phone] (Monday-Friday 7am-9pm or Saturday and Sunday 9am-5pm) to put a credit card on file.  Bring a credit card or cash on the day of your procedure- please leave with a family member rather than bringing it into the preop area.    Current Visitor Policy:  Visitors must be free of fever and symptoms to be in our facilities.  No more than 2 visitors per patient are allowed.  Additional guidelines may vary, based on patient care area or patient's condition.  Patients in semiprivate rooms may have visitors, but visits should be coordinated so only two total visitors are in a room at a time due to space limitations.  Children younger than age 37 are allowed to visit inpatients.    Thank you for participating in your Preoperative Assessment Clinic visit today.    If you have any changes to your health or hospitalizations between now and your surgery, please call us at [phone] for Main instructions.    Instructions given to patient via: MyChart.    15:35 it was a pleasure speaking with you today.  These are the instructions we discussed.  If you have any changes in your health or questions about your instructions, please reach out to me via email or phone call.     Debbie, RN   [phone]  [email or web address]

## 2022-05-06 ENCOUNTER — Encounter: Admit: 2022-05-06 | Discharge: 2022-05-06 | Payer: BC Managed Care – PPO

## 2022-05-15 ENCOUNTER — Encounter: Admit: 2022-05-15 | Discharge: 2022-05-15 | Payer: BC Managed Care – PPO

## 2022-05-16 ENCOUNTER — Encounter: Admit: 2022-05-16 | Discharge: 2022-05-16 | Payer: BC Managed Care – PPO

## 2022-05-16 ENCOUNTER — Ambulatory Visit: Admit: 2022-05-16 | Discharge: 2022-05-16 | Payer: BC Managed Care – PPO

## 2022-05-16 DIAGNOSIS — T7840XA Allergy, unspecified, initial encounter: Secondary | ICD-10-CM

## 2022-05-16 DIAGNOSIS — J189 Pneumonia, unspecified organism: Secondary | ICD-10-CM

## 2022-05-16 DIAGNOSIS — F32A Depression: Secondary | ICD-10-CM

## 2022-05-16 DIAGNOSIS — E119 Type 2 diabetes mellitus without complications: Secondary | ICD-10-CM

## 2022-05-16 DIAGNOSIS — F419 Anxiety disorder, unspecified: Secondary | ICD-10-CM

## 2022-05-16 MED ORDER — LIDOCAINE (PF) 10 MG/ML (1 %) IJ SOLN
SUBCUTANEOUS | 0 refills | Status: CP
Start: 2022-05-16 — End: ?
  Administered 2022-05-16: 17:00:00 2 mL via SUBCUTANEOUS

## 2022-05-16 MED ORDER — FENTANYL CITRATE (PF) 50 MCG/ML IJ SOLN
INTRAVENOUS | 0 refills | Status: CP
Start: 2022-05-16 — End: ?
  Administered 2022-05-16: 17:00:00 50 ug via INTRAVENOUS

## 2022-05-16 MED ORDER — BUPIVACAINE HCL 0.5 % (5 MG/ML) IJ SOLN
0 refills | Status: CP
Start: 2022-05-16 — End: ?
  Administered 2022-05-16: 17:00:00 12 mL

## 2022-05-16 MED ORDER — LIDOCAINE (PF) 20 MG/ML (2 %) IJ SOLN
INTRAVENOUS | 0 refills | Status: DC
Start: 2022-05-16 — End: 2022-05-16
  Administered 2022-05-16: 17:00:00 80 mg via INTRAVENOUS

## 2022-05-16 MED ORDER — DEXAMETHASONE SODIUM PHOSPHATE 10 MG/ML IJ SOLN
0 refills | Status: CP
Start: 2022-05-16 — End: ?
  Administered 2022-05-16: 17:00:00 4 mg

## 2022-05-16 MED ORDER — PROPOFOL 10 MG/ML IV EMUL 50 ML (INFUSION)(AM)(OR)
INTRAVENOUS | 0 refills | Status: DC
Start: 2022-05-16 — End: 2022-05-16
  Administered 2022-05-16: 17:00:00 120 ug/kg/min via INTRAVENOUS

## 2022-05-16 MED ORDER — CEFAZOLIN 1 GRAM IJ SOLR
INTRAVENOUS | 0 refills | Status: DC
Start: 2022-05-16 — End: 2022-05-16
  Administered 2022-05-16: 17:00:00 2 g via INTRAVENOUS

## 2022-05-16 MED ORDER — ONDANSETRON HCL (PF) 4 MG/2 ML IJ SOLN
INTRAVENOUS | 0 refills | Status: DC
Start: 2022-05-16 — End: 2022-05-16
  Administered 2022-05-16: 17:00:00 4 mg via INTRAVENOUS

## 2022-05-16 MED ORDER — LIDOCAINE (PF) 20 MG/ML (2 %) IJ SOLN
0 refills | Status: CP
Start: 2022-05-16 — End: ?
  Administered 2022-05-16: 17:00:00 12 mL

## 2022-05-16 MED ORDER — MIDAZOLAM 1 MG/ML IJ SOLN
INTRAVENOUS | 0 refills | Status: CP
Start: 2022-05-16 — End: ?
  Administered 2022-05-16: 17:00:00 2 mg via INTRAVENOUS

## 2022-05-16 MED ADMIN — LACTATED RINGERS IV SOLP [4318]: 1000 mL | INTRAVENOUS | @ 15:00:00 | Stop: 2022-05-16 | NDC 00338011704

## 2022-05-17 ENCOUNTER — Encounter: Admit: 2022-05-17 | Discharge: 2022-05-17 | Payer: BC Managed Care – PPO

## 2022-05-17 MED ORDER — HYDROCODONE-ACETAMINOPHEN 5-325 MG PO TAB
1-2 | ORAL_TABLET | ORAL | 0 refills | 30.00000 days | Status: AC | PRN
Start: 2022-05-17 — End: ?

## 2022-05-17 NOTE — Telephone Encounter
Call from husband regarding patient.     Surgey yesterday.    Stating oxycodone is having no pain relief.     Patient unable to sleep and is finding eating difficult due to pain.    Endorses taking supplemental tylenol and ibuprofen.      Will route to Dr. Lehman Prom to advise on possibly sending Norco

## 2022-05-18 ENCOUNTER — Encounter: Admit: 2022-05-18 | Discharge: 2022-05-18 | Payer: BC Managed Care – PPO

## 2022-05-18 DIAGNOSIS — F419 Anxiety disorder, unspecified: Secondary | ICD-10-CM

## 2022-05-18 DIAGNOSIS — J189 Pneumonia, unspecified organism: Secondary | ICD-10-CM

## 2022-05-18 DIAGNOSIS — T7840XA Allergy, unspecified, initial encounter: Secondary | ICD-10-CM

## 2022-05-18 DIAGNOSIS — E119 Type 2 diabetes mellitus without complications: Secondary | ICD-10-CM

## 2022-05-18 DIAGNOSIS — F32A Depression: Secondary | ICD-10-CM

## 2022-06-04 ENCOUNTER — Encounter: Admit: 2022-06-04 | Discharge: 2022-06-04 | Payer: BC Managed Care – PPO

## 2022-06-04 ENCOUNTER — Ambulatory Visit: Admit: 2022-06-04 | Discharge: 2022-06-04 | Payer: BC Managed Care – PPO

## 2022-06-04 DIAGNOSIS — T7840XA Allergy, unspecified, initial encounter: Secondary | ICD-10-CM

## 2022-06-04 DIAGNOSIS — F419 Anxiety disorder, unspecified: Secondary | ICD-10-CM

## 2022-06-04 DIAGNOSIS — J189 Pneumonia, unspecified organism: Secondary | ICD-10-CM

## 2022-06-04 DIAGNOSIS — F32A Depression: Secondary | ICD-10-CM

## 2022-06-04 DIAGNOSIS — G5622 Lesion of ulnar nerve, left upper limb: Secondary | ICD-10-CM

## 2022-06-04 DIAGNOSIS — E119 Type 2 diabetes mellitus without complications: Secondary | ICD-10-CM

## 2022-06-04 DIAGNOSIS — G5602 Carpal tunnel syndrome, left upper limb: Secondary | ICD-10-CM

## 2022-06-04 NOTE — Progress Notes
Subjective:       History of Present Illness  Renee Christensen is a 31 y.o. female presenting for her first post op appointment after carpal, cubital, guyon release and neurolysis of her radial sensory nerve with wrap.  Since I saw her last time she has been doing well and getting back to her normal activities of daily living, she had 2 small areas on her right elbow incision where Monocryl sutures had developed suture abscesses and came out on their own.  Otherwise, the patient reports the sensitivity over the radial nerve has been steadily improving.    She is now status post left carpal, cubital, Guyon's canal release doing well.  She reports her nighttime symptoms have resolved.  Unfortunately, the patient reports having injured her right shoulder.  She is presented to a clinic in [place] in this regard.  She does report some tenderness and a slow healing of the skin incision her elbow, and ready to get her stitches out in her hand.     Review of Systems   Constitutional: Negative.    HENT: Negative.    Eyes: Negative.    Respiratory: Negative.    Cardiovascular: Negative.    Gastrointestinal: Negative.    Endocrine: Negative.    Genitourinary: Negative.    Musculoskeletal: Negative.    Skin: Negative.    Allergic/Immunologic: Negative.    Neurological: Negative.    Hematological: Negative.    Psychiatric/Behavioral: Negative.          Objective:         ? atorvastatin (LIPITOR) 80 mg tablet Take one tablet by mouth daily.   ? cetirizine (ZYRTEC) 10 mg tablet Take one tablet by mouth every morning.   ? citalopram (CELEXA) 20 mg tablet Take one tablet by mouth daily.   ? furosemide (LASIX) 20 mg tablet Please take 2 tablets daily for 2 days and then 1 tablets daily for 2 days.   ? gabapentin (NEURONTIN) 100 mg capsule Take one capsule by mouth three times daily.   ? HYDROcodone/acetaminophen (NORCO) 5/325 mg tablet Take one tablet to two tablets by mouth every 6 hours as needed for Pain.   ? insulin aspart (U-100) (NOVOLOG FLEXPEN U-100 INSULIN) 100 unit/mL (3 mL) PEN Inject  under the skin three times daily with meals. Uses per sliding scale if needed for pump failure   ? insulin aspart U-100 (NOVOLOG) 100 unit/mL injection Insulin pump; Medtronic 670G: 12A-2.2,7a-2, 3- 2.20, 9p-2.00 I:C 12a-6.2,5a-6.2,11a-6.2,5p-6.2, 9p-6.2 SF 30 Target 120 AIT 2hrs TDD 75 U   ? insulin glargine (LANTUS SOLOSTAR U-100 INSULIN) 100 unit/mL (3 mL) subcutaneous PEN Inject twenty five Units under the skin at bedtime as needed (pump failure).   ? lisinopril (ZESTRIL) 5 mg tablet Take one tablet by mouth daily.   ? metFORMIN (GLUCOPHAGE) 500 mg tablet Take one tablet by mouth twice daily with meals.   ? oxyCODONE (ROXICODONE) 5 mg tablet Take one tablet by mouth every 6 hours as needed for Pain.   ? SUMAtriptan succinate (IMITREX) 100 mg tablet Take one tablet by mouth as Needed.     Vitals:    06/04/22 0901   BP: 138/71   Pulse: 88   Weight: 83.9 kg (185 lb)   Height: 162.6 cm (5' 4)     Body mass index is 31.76 kg/m?Marland Kitchen     Physical Exam  alert, oriented  Breathing unlabored  RR  RUE:  INcisions in the elbow, wrist dorsally and volarly well approximated without  surrounding erythema or drainage.  Scar on the dorsum of her right wrist has begun to soften and swelling is mild.  Constitutional: Well developed, well nourished, in no acute distress  Psychological: normal affect, mood    HEENT: normocephalic, atraumatic, anicteric  Neck: supple, midline trachea, normal ROM   Respiratory: Normal effort, no respiratory distress, no cyanosis  Cardiovascular: visible extremities are warm and well perfused      Left UPPER EXTREMITY   Observation: Wrist incision well approximated with sutures in place, no surrounding erythema or drainage  Left elbow incision well approximated with hyperemic scar, small area of separation with a scab in place.  No drainage.  Neurovascular: Normal sensibility to light touch in the ulnar, radial and median nerve distributions, fingers well perfused with normal capillary refill. 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       Assessment and Plan:  Renee Christensen is a 31 y.o. female s/p carpal, cubital and guyons release as well as radial nerve neurolysis and wrap overall doing well.  Her symptoms from her radial neurolysis are continuing to improve each month.  Encouraged scar massage.  -On the left side, she is healing appropriately.  She will follow-up as needed    Gracy Bruins., MD  06/04/22 9:19 AM

## 2022-06-17 ENCOUNTER — Encounter: Admit: 2022-06-17 | Discharge: 2022-06-17 | Payer: BC Managed Care – PPO

## 2022-06-17 ENCOUNTER — Ambulatory Visit: Admit: 2022-06-17 | Discharge: 2022-06-18 | Payer: BC Managed Care – PPO

## 2022-06-17 DIAGNOSIS — J189 Pneumonia, unspecified organism: Secondary | ICD-10-CM

## 2022-06-17 DIAGNOSIS — T7840XA Allergy, unspecified, initial encounter: Secondary | ICD-10-CM

## 2022-06-17 DIAGNOSIS — E119 Type 2 diabetes mellitus without complications: Secondary | ICD-10-CM

## 2022-06-17 DIAGNOSIS — F419 Anxiety disorder, unspecified: Secondary | ICD-10-CM

## 2022-06-17 DIAGNOSIS — F32A Depression: Secondary | ICD-10-CM

## 2022-06-17 NOTE — Patient Instructions
It was a pleasure seeing you in clinic today. Please contact our office if you have any questions or concerns.    Thank You,  Roza Creamer RN-BSN  Clinical Nurse for Dr. Tyler Fox and Randall Stroup PA-C  [facility]  [address]  [place], [place] [address]  P: [phone]  F: [phone]

## 2022-06-18 DIAGNOSIS — M7551 Bursitis of right shoulder: Secondary | ICD-10-CM

## 2022-07-15 ENCOUNTER — Encounter: Admit: 2022-07-15 | Discharge: 2022-07-15 | Payer: BC Managed Care – PPO

## 2022-08-20 ENCOUNTER — Encounter: Admit: 2022-08-20 | Discharge: 2022-08-20 | Payer: BC Managed Care – PPO

## 2022-08-26 ENCOUNTER — Ambulatory Visit: Admit: 2022-08-26 | Discharge: 2022-08-27 | Payer: BC Managed Care – PPO

## 2022-08-26 ENCOUNTER — Encounter: Admit: 2022-08-26 | Discharge: 2022-08-26 | Payer: BC Managed Care – PPO

## 2022-08-26 DIAGNOSIS — E119 Type 2 diabetes mellitus without complications: Secondary | ICD-10-CM

## 2022-08-26 DIAGNOSIS — F419 Anxiety disorder, unspecified: Secondary | ICD-10-CM

## 2022-08-26 DIAGNOSIS — T7840XA Allergy, unspecified, initial encounter: Secondary | ICD-10-CM

## 2022-08-26 DIAGNOSIS — M7551 Bursitis of right shoulder: Secondary | ICD-10-CM

## 2022-08-26 DIAGNOSIS — F32A Depression: Secondary | ICD-10-CM

## 2022-08-26 DIAGNOSIS — J189 Pneumonia, unspecified organism: Secondary | ICD-10-CM

## 2022-08-26 MED ORDER — CELECOXIB 100 MG PO CAP
100 mg | ORAL_CAPSULE | Freq: Two times a day (BID) | ORAL | 1 refills | Status: AC
Start: 2022-08-26 — End: ?

## 2022-08-26 NOTE — Patient Instructions
It was a pleasure seeing you in clinic today. Please contact our office if you have any questions or concerns.     Thank You,  Kimberly RN-BSN  Clinical Nurse for Dr. Tyler Fox and Randall Stroup PA-C  [facility]  [address]  [place], [place] [address]  P: [phone]  F: [phone]

## 2022-08-26 NOTE — Progress Notes
Date of Service: 08/26/2022     Subjective:       History of Present Illness         Renee Christensen returns to clinic with her husband. She was last evaluated on 06/17/22 and received a right shoulder corticosteroid injection. She did not receive any relief from the injection. She was able to tolerate physical therapy for the first two weeks but then felt like the shoulder started getting weaker and more painful. She describes the pain as the same and occasionally worse than before. She describes anterior pain that radiates laterally.      Objective:    Medications    Current Outpatient Medications:   ?  atorvastatin (LIPITOR) 80 mg tablet, Take one tablet by mouth daily., Disp: , Rfl:   ?  cetirizine (ZYRTEC) 10 mg tablet, Take one tablet by mouth every morning., Disp: , Rfl:   ?  citalopram (CELEXA) 20 mg tablet, Take one tablet by mouth daily., Disp: , Rfl:   ?  furosemide (LASIX) 20 mg tablet, Please take 2 tablets daily for 2 days and then 1 tablets daily for 2 days., Disp: 8 tablet, Rfl: 0  ?  gabapentin (NEURONTIN) 100 mg capsule, Take one capsule by mouth three times daily., Disp: 90 capsule, Rfl: 0  ?  HYDROcodone/acetaminophen (NORCO) 5/325 mg tablet, Take one tablet to two tablets by mouth every 6 hours as needed for Pain., Disp: 25 tablet, Rfl: 0  ?  insulin aspart (U-100) (NOVOLOG FLEXPEN U-100 INSULIN) 100 unit/mL (3 mL) PEN, Inject  under the skin three times daily with meals. Uses per sliding scale if needed for pump failure, Disp: , Rfl:   ?  insulin aspart U-100 (NOVOLOG) 100 unit/mL injection, Insulin pump; Medtronic 670G: 12A-2.2,7a-2, 3- 2.20, 9p-2.00 I:C 12a-6.2,5a-6.2,11a-6.2,5p-6.2, 9p-6.2 SF 30 Target 120 AIT 2hrs TDD 75 U, Disp: , Rfl:   ?  insulin glargine (LANTUS SOLOSTAR U-100 INSULIN) 100 unit/mL (3 mL) subcutaneous PEN, Inject twenty five Units under the skin at bedtime as needed (pump failure)., Disp: , Rfl:   ?  lisinopril (ZESTRIL) 5 mg tablet, Take one tablet by mouth daily., Disp: , Rfl:   ?  metFORMIN (GLUCOPHAGE) 500 mg tablet, Take one tablet by mouth twice daily with meals., Disp: , Rfl:   ?  oxyCODONE (ROXICODONE) 5 mg tablet, Take one tablet by mouth every 6 hours as needed for Pain., Disp: 15 tablet, Rfl: 0  ?  SUMAtriptan succinate (IMITREX) 100 mg tablet, Take one tablet by mouth as Needed., Disp: , Rfl:     Vitals  There were no vitals filed for this visit.  Ht Readings from Last 1 Encounters:   08/26/22 162.6 cm (5' 4)      Wt Readings from Last 1 Encounters:   08/26/22 83.9 kg (185 lb)      Body mass index is 31.76 kg/m?Marland Kitchen      Physical Exam  Ortho Exam  Alert, well-appearing female. She is appropriately dressed and interactive. Oriented x 3.      Right Shoulder Range of Motion:    Forward flexion 150     External rotation 35     Internal rotation T12       On evaluation of her right shoulder, Neer and Hawkins maneuvers are positive. Sensation grossly intact to light touch in the median, ulnar and radial nerve distributions. Fingers well perfused.        Imaging  MRI from 06/07/22 of the patient's right shoulder  was again reviewed. Subacromial fluid signal. Rotator cuff grossly intact.    Assessment and Plan:  #1 Right subacromial bursitis.     I reviewed with Renee Christensen and her husband the features of her imaging and exam.  I do not think a surgical procedure is likely to be of much benefit to her but unfortunately we have nearly exhausted nonoperative options I have available.  I have offered her a prescription for Celebrex.  She was appreciative of our discussion.  All of her questions were answered.    ATTESTATION    I have taken down these notes in the presence of Dr. Hart Robinsons.    STAFF NAME: Babette Relic  DATE: 08/26/2022

## 2022-09-05 ENCOUNTER — Encounter: Admit: 2022-09-05 | Discharge: 2022-09-05 | Payer: BC Managed Care – PPO

## 2022-09-10 ENCOUNTER — Encounter: Admit: 2022-09-10 | Discharge: 2022-09-10 | Payer: BC Managed Care – PPO

## 2022-09-10 ENCOUNTER — Ambulatory Visit: Admit: 2022-09-10 | Discharge: 2022-09-10 | Payer: BC Managed Care – PPO

## 2022-09-10 DIAGNOSIS — J189 Pneumonia, unspecified organism: Secondary | ICD-10-CM

## 2022-09-10 DIAGNOSIS — F419 Anxiety disorder, unspecified: Secondary | ICD-10-CM

## 2022-09-10 DIAGNOSIS — T7840XA Allergy, unspecified, initial encounter: Secondary | ICD-10-CM

## 2022-09-10 DIAGNOSIS — F32A Depression: Secondary | ICD-10-CM

## 2022-09-10 DIAGNOSIS — M25531 Pain in right wrist: Secondary | ICD-10-CM

## 2022-09-10 DIAGNOSIS — E119 Type 2 diabetes mellitus without complications: Secondary | ICD-10-CM

## 2022-09-10 MED ORDER — TRIAMCINOLONE ACETONIDE 40 MG/ML IJ SUSP
40 mg | Freq: Once | INTRAMUSCULAR | 0 refills | Status: CP
Start: 2022-09-10 — End: ?
  Administered 2022-09-10: 15:00:00 40 mg via INTRAMUSCULAR

## 2022-09-10 MED ORDER — LIDOCAINE-EPINEPHRINE 1 %-1:100,000 IJ SOLN
1 mL | Freq: Once | 0 refills | Status: CP
Start: 2022-09-10 — End: ?
  Administered 2022-09-10: 15:00:00 1 mL

## 2022-09-10 NOTE — Progress Notes
Subjective:       History of Present Illness  Renee Christensen is a 31 y.o. female presenting after carpal, cubital, guyon release and neurolysis of her radial sensory nerve with wrap March 08, 2022.  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  Her sensitivity of the radial nerve has significantly improved compared to preop..  Otherwise, the patient reports the sensitivity over the radial nerve has been steadily improving.    Her husband is still recovering from shoulder surgery and she plans on to have surgery on her shoulder once he recovers.    She is here today for evaluation of right ulnar-sided wrist pain.  This is not the result of an injury, she noticed several weeks ago pain with many activities of daily living.  She works at [employer] and using some of the equipment or even just using her hands for cooking cleaning produces pain near her ulnar styloid which radiates up the wrist and sometimes down her finger.  Pain is aggravated with rotation of the wrist and supination or pushing on the wrist.  She has not tried splinting or formal therapy.    X-ray from January did not show any bony abnormality.  She has not had any corticosteroid injection or MRI.         Review of Systems   Constitutional: Negative.    HENT: Negative.    Eyes: Negative.    Respiratory: Negative.    Cardiovascular: Negative.    Gastrointestinal: Negative.    Endocrine: Negative.    Genitourinary: Negative.    Musculoskeletal: Right wrist pain  Skin: Negative.    Allergic/Immunologic: Negative.    Neurological: Negative.    Hematological: Negative.    Psychiatric/Behavioral: Negative.          Objective:         ? atorvastatin (LIPITOR) 80 mg tablet Take one tablet by mouth daily.   ? celecoxib (CELEBREX) 100 mg capsule Take one capsule by mouth twice daily.   ? cetirizine (ZYRTEC) 10 mg tablet Take one tablet by mouth every morning.   ? citalopram (CELEXA) 20 mg tablet Take one tablet by mouth daily.   ? furosemide (LASIX) 20 mg tablet Please take 2 tablets daily for 2 days and then 1 tablets daily for 2 days.   ? gabapentin (NEURONTIN) 100 mg capsule Take one capsule by mouth three times daily.   ? HYDROcodone/acetaminophen (NORCO) 5/325 mg tablet Take one tablet to two tablets by mouth every 6 hours as needed for Pain.   ? insulin aspart (U-100) (NOVOLOG FLEXPEN U-100 INSULIN) 100 unit/mL (3 mL) PEN Inject  under the skin three times daily with meals. Uses per sliding scale if needed for pump failure   ? insulin aspart U-100 (NOVOLOG) 100 unit/mL injection Insulin pump; Medtronic 670G: 12A-2.2,7a-2, 3- 2.20, 9p-2.00 I:C 12a-6.2,5a-6.2,11a-6.2,5p-6.2, 9p-6.2 SF 30 Target 120 AIT 2hrs TDD 75 U   ? insulin glargine (LANTUS SOLOSTAR U-100 INSULIN) 100 unit/mL (3 mL) subcutaneous PEN Inject twenty five Units under the skin at bedtime as needed (pump failure).   ? lisinopril (ZESTRIL) 5 mg tablet Take one tablet by mouth daily.   ? metFORMIN (GLUCOPHAGE) 500 mg tablet Take one tablet by mouth twice daily with meals.   ? oxyCODONE (ROXICODONE) 5 mg tablet  Take one tablet by mouth every 6 hours as needed for Pain.   ? SUMAtriptan succinate (IMITREX) 100 mg tablet Take one tablet by mouth as Needed.     There were no vitals filed for this visit.  There is no height or weight on file to calculate BMI.     Physical Exam  alert, oriented  Breathing unlabored  RR  RUE:  INcisions in the elbow, wrist dorsally and volarly well approximated without surrounding erythema or drainage.  Scar on the dorsum of her right wrist has begun to soften and swelling is mild.  Constitutional: Well developed, well nourished, in no acute distress  Psychological: normal affect, mood    HEENT: normocephalic, atraumatic, anicteric  Neck: supple, midline trachea, normal ROM   Respiratory: Normal effort, no respiratory distress, no cyanosis  Cardiovascular: visible extremities are warm and well perfused      Left UPPER EXTREMITY   Observation: Wrist incision well approximated with sutures in place, no surrounding erythema or drainage  Left elbow incision well approximated with hyperemic scar, small area of separation with a scab in place.  No drainage.  Neurovascular: Normal sensibility to light touch in the ulnar, radial and median nerve distributions, fingers well perfused with normal capillary refill. 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      RIGHT UPPER EXTREMITY ULNAR SIDED PAIN  Skin: intact, normal color and temperature   Observation: no edema of right dorsal wrist. no ECU subluxation.  Tenderness:    Snuffbox: none on right, non on left    Scapholunate interval: none on right, none on left    Lunotriquetral interval: none on right, none on left    ECU: yes on right, none on left    DRUJ: none on right, none on left    Fovea/TFCC: yes on right, none on left    Press test: negative on right, negative on left  Vascular: 2+ radial pulses bilaterally. Hand and fingers are warm, well perfused with normal capillary refill.   Musculoskeletal: Forearm and hand compartments are soft, no pain with passive motion of the fingers.   WRIST Range of Motion (right / left) (in degrees):  wnl and symmetric  Watson's Scaphoid Shift: neg on right, neg on left  LT Ballottement:neg on right, neg on left  Piano key:neg on right, neg on left  DRUJ: neg on right, neg on left  Table top press test: neg on right, neg on left      Neuro: Intact sensibility to light touch in the ulnar, radial and median nerve distributions, including dorsal ulnar sensory branch        Assessment and Plan:  Renee Christensen is a 31 y.o. female s/p carpal, cubital and guyons release as well as radial nerve neurolysis and wrap overall doing well.  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 Treatment options include splint or cast immobilization, occupational therapy, steroid injection, and arthroscopic or open surgery. A wrist arthroscopy would help with diagnosis by directly visualizing the TFCC, ligaments, and carpal bones. Arthroscopy could also potentially be therapeutic as  a debridement and possible repair could also be done at that time.      However, given that the patient's symptoms have improved and the acute presentation, I would recommend a trial of steroid injection including over the ECU. If she fails this then next step would be immobilization followed by a course of occupational therapy, prior to considering more invasive options.  The patient is agreeable to this plan. She should avoid sports and heavy lifting with the left wrist to allow for this to heal.     The reason to proceed with arthroscopic surgery would be to better assess the ulnar side of the wrist and to improve her symptoms of pain. Risks of surgery include, but are not limited to, bleeding, infection, scar, nerve injury (ie dorsal ulnar sensory nerve) or injury to other adjacent structures & failure to improve. The rationale and alternatives were discussed, and she indicated understanding. We agree that at this time we will NOT pursue surgery.    Follow up as needed.    Gracy Bruins., MD  09/10/22 8:05 AM

## 2022-09-10 NOTE — Procedures
Procedure:  right ulnar wrist cortisone injection/injection  Diagnosis:  right ulnar wrist pain    Consent obtained, timeout done, skin prepped with alcohol  1 mL 1% lidocaine infiltrated for local anesthesia  1 mL kenalog 40 injected into sheath  Patient tolerated the procedure well

## 2022-10-14 ENCOUNTER — Encounter: Admit: 2022-10-14 | Discharge: 2022-10-14 | Payer: BC Managed Care – PPO

## 2023-03-13 ENCOUNTER — Encounter: Admit: 2023-03-13 | Discharge: 2023-03-13 | Payer: BC Managed Care – PPO

## 2023-06-10 ENCOUNTER — Ambulatory Visit: Admit: 2023-06-10 | Discharge: 2023-06-10 | Payer: BC Managed Care – PPO

## 2023-06-10 ENCOUNTER — Encounter: Admit: 2023-06-10 | Discharge: 2023-06-10 | Payer: BC Managed Care – PPO

## 2023-06-10 DIAGNOSIS — T7840XA Allergy, unspecified, initial encounter: Secondary | ICD-10-CM

## 2023-06-10 DIAGNOSIS — F32A Depression: Secondary | ICD-10-CM

## 2023-06-10 DIAGNOSIS — M25531 Pain in right wrist: Secondary | ICD-10-CM

## 2023-06-10 DIAGNOSIS — E119 Type 2 diabetes mellitus without complications: Secondary | ICD-10-CM

## 2023-06-10 DIAGNOSIS — J189 Pneumonia, unspecified organism: Secondary | ICD-10-CM

## 2023-06-10 DIAGNOSIS — F419 Anxiety disorder, unspecified: Secondary | ICD-10-CM

## 2023-06-10 DIAGNOSIS — I219 Acute myocardial infarction, unspecified: Secondary | ICD-10-CM

## 2023-06-10 NOTE — Progress Notes
Subjective:       History of Present Illness  Renee Christensen is a 32 y.o. female presenting after carpal, cubital, guyon release and neurolysis of her radial sensory nerve with wrap March 08, 2022.  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  Her sensitivity of the radial nerve has significantly improved compared to preop..  Otherwise, the patient reports the sensitivity over the radial nerve has been steadily improving.    Her husband is still recovering from shoulder surgery and she plans on to have surgery on her shoulder once he recovers.    She is here today for evaluation of right ulnar-sided wrist pain.  This is not the result of an injury, she noticed several weeks ago pain with many activities of daily living.  She works at [employer] and using some of the equipment or even just using her hands for cooking cleaning produces pain near her ulnar styloid which radiates up the wrist and sometimes down her finger.  Pain is aggravated with rotation of the wrist and supination or pushing on the wrist.  She has not tried splinting or formal therapy.    X-ray from January did not show any bony abnormality.  She has not had any corticosteroid injection or MRI.      ///Since her last visit the patient reports    Moderate if any significant improvement to her right wrist.  She had a steroid injection at her last visit.             Review of Systems   Constitutional: Negative.    HENT: Negative.    Eyes: Negative.    Respiratory: Negative.    Cardiovascular: Negative.    Gastrointestinal: Negative.    Endocrine: Negative.    Genitourinary: Negative.    Musculoskeletal: Right wrist pain  Skin: Negative.    Allergic/Immunologic: Negative.    Neurological: Negative.    Hematological: Negative.    Psychiatric/Behavioral: Negative. Objective:          albuterol 0.083% (PROVENTIL) 2.5 mg /3 mL (0.083 %) nebulizer solution     aspirin EC (ASPIR-LOW) 81 mg tablet Take one tablet by mouth daily.    blood-glucose sensor (GUARDIAN 4 GLUCOSE SENSOR) sensor device     celecoxib (CELEBREX) 100 mg capsule Take one capsule by mouth twice daily.    cetirizine (ZYRTEC) 10 mg tablet Take one tablet by mouth every morning.    citalopram (CELEXA) 20 mg tablet Take one tablet by mouth daily.    dapagliflozin propanediol (FARXIGA) 10 mg tablet Take  by mouth.    fluconazole (DIFLUCAN) 100 mg tablet Take one tablet by mouth daily.    furosemide (LASIX) 20 mg tablet Please take 2 tablets daily for 2 days and then 1 tablets daily for 2 days.    gabapentin (NEURONTIN) 100 mg capsule Take one capsule by mouth three times daily.    HYDROcodone/acetaminophen (NORCO) 5/325 mg tablet     insulin aspart U-100 (NOVOLOG) 100 unit/mL injection Insulin pump; Medtronic 670G: 12A-2.2,7a-2, 3- 2.20, 9p-2.00 I:C 12a-6.2,5a-6.2,11a-6.2,5p-6.2, 9p-6.2 SF 30 Target 120 AIT 2hrs TDD 75 U    lisinopriL (ZESTRIL) 2.5 mg tablet     loratadine (CLARITIN) 10 mg tablet Take one tablet by mouth daily.    metoprolol tartrate 25 mg tablet  Take  by mouth.    omeprazole DR (PRILOSEC) 40 mg capsule Take one capsule by mouth daily before breakfast.    oxyCODONE (ROXICODONE) 5 mg tablet Take one tablet by mouth every 6 hours as needed for Pain.    prasugreL (EFFIENT) 10 mg tablet Take  by mouth.    rosuvastatin (CRESTOR) 40 mg tablet Take  by mouth.    SUMAtriptan succinate (IMITREX) 100 mg tablet Take one tablet by mouth as Needed.     Vitals:    06/10/23 1549   BP: 132/60   Pulse: 87   PainSc: Eight   Weight: 83.9 kg (185 lb)   Height: 162.6 cm (5' 4)     Body mass index is 31.76 kg/m?Marland Kitchen     Physical Exam  alert, oriented  Breathing unlabored  RR  RUE:  INcisions in the elbow, wrist dorsally and volarly well approximated without surrounding erythema or drainage.  Scar on the dorsum of her right wrist has begun to soften and swelling is mild.  Constitutional: Well developed, well nourished, in no acute distress  Psychological: normal affect, mood    HEENT: normocephalic, atraumatic, anicteric  Neck: supple, midline trachea, normal ROM   Respiratory: Normal effort, no respiratory distress, no cyanosis  Cardiovascular: visible extremities are warm and well perfused      Left UPPER EXTREMITY   Observation: Wrist incision well approximated with sutures in place, no surrounding erythema or drainage  Left elbow incision well approximated with hyperemic scar, small area of separation with a scab in place.  No drainage.  Neurovascular: Normal sensibility to light touch in the ulnar, radial and median nerve distributions, fingers well perfused with normal capillary refill. 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      RIGHT UPPER EXTREMITY ULNAR SIDED PAIN  Skin: intact, normal color and temperature   Observation: no edema of right dorsal wrist. no ECU subluxation.  Tenderness:    Snuffbox: none on right, non on left    Scapholunate interval: none on right, none on left    Lunotriquetral interval: none on right, none on left    ECU: yes on right, none on left    DRUJ: none on right, none on left    Fovea/TFCC: yes on right, none on left    Press test: negative on right, negative on left  Vascular: 2+ radial pulses bilaterally. Hand and fingers are warm, well perfused with normal capillary refill.   Musculoskeletal: Forearm and hand compartments are soft, no pain with passive motion of the fingers.   WRIST Range of Motion (right / left) (in degrees):  wnl and symmetric  Watson's Scaphoid Shift: neg on right, neg on left  LT Ballottement:neg on right, neg on left  Piano key:neg on right, neg on left  DRUJ: neg on right, neg on left  Table top press test: neg on right, neg on left      Neuro: Intact sensibility to light touch in the ulnar, radial and median nerve distributions, including dorsal ulnar sensory branch        Assessment and Plan:  Renee Christensen is a 32 y.o. female s/p carpal, cubital and guyons release as well as radial nerve neurolysis and wrap overall doing well.  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 Treatment options include splint or cast immobilization, occupational therapy, steroid injection, and arthroscopic or open surgery. A wrist arthroscopy would help with diagnosis by directly visualizing the TFCC, ligaments, and carpal bones. Arthroscopy could also potentially be therapeutic as a debridement and possible repair could also be done at that time.      However, given that the patient's symptoms have improved and the acute presentation, I would recommend a trial of steroid injection including over the ECU.  Unfortunately this did not improve her symptoms significantly.  For this reason we discussed the neck step would be immobilization followed by a course of occupational therapy, prior to considering more invasive options.  The patient is agreeable to this plan. She should avoid sports and heavy lifting with the left wrist to allow for this to heal.     The reason to proceed with arthroscopic surgery would be to better assess the ulnar side of the wrist and to improve her symptoms of pain.  Her exam is more consistent with ECU pathology rather than TFC.  risks of surgery include, but are not limited to, bleeding, infection, scar, nerve injury (ie dorsal ulnar sensory nerve) or injury to other adjacent structures & failure to improve. The rationale and alternatives were discussed, and she indicated understanding. We agree that at this time we will NOT pursue surgery.      We will schedule the patient an appointment to be placed in a [place] style cast and follow-up in 5 weeks.        Gracy Bruins., MD  06/10/23 4:48 PM

## 2023-06-12 ENCOUNTER — Encounter: Admit: 2023-06-12 | Discharge: 2023-06-12 | Payer: BC Managed Care – PPO

## 2023-06-12 ENCOUNTER — Ambulatory Visit: Admit: 2023-06-12 | Discharge: 2023-06-13 | Payer: BC Managed Care – PPO

## 2023-06-12 DIAGNOSIS — Z978 Presence of other specified devices: Secondary | ICD-10-CM

## 2023-06-12 NOTE — Progress Notes
Patient presents today for removal of cast. Skin and incision appears dry and intact. Patient will go into a cast today. A well padded, non-waterproof cast was applied using fiberglass and soft roll padding. she was advised on proper cast care and weightbearing instructions/precautions. she will follow-up with @[facility]@ as scheduled.

## 2023-06-26 ENCOUNTER — Encounter: Admit: 2023-06-26 | Discharge: 2023-06-26 | Payer: BC Managed Care – PPO

## 2023-07-01 ENCOUNTER — Encounter: Admit: 2023-07-01 | Discharge: 2023-07-01 | Payer: BC Managed Care – PPO

## 2023-07-01 ENCOUNTER — Ambulatory Visit: Admit: 2023-07-01 | Discharge: 2023-07-01 | Payer: BC Managed Care – PPO

## 2023-07-01 DIAGNOSIS — M25531 Pain in right wrist: Secondary | ICD-10-CM

## 2023-07-01 DIAGNOSIS — F419 Anxiety disorder, unspecified: Secondary | ICD-10-CM

## 2023-07-01 DIAGNOSIS — T7840XA Allergy, unspecified, initial encounter: Secondary | ICD-10-CM

## 2023-07-01 DIAGNOSIS — J189 Pneumonia, unspecified organism: Secondary | ICD-10-CM

## 2023-07-01 DIAGNOSIS — I219 Acute myocardial infarction, unspecified: Secondary | ICD-10-CM

## 2023-07-01 DIAGNOSIS — F32A Depression: Secondary | ICD-10-CM

## 2023-07-01 DIAGNOSIS — E119 Type 2 diabetes mellitus without complications: Secondary | ICD-10-CM

## 2023-07-01 NOTE — Progress Notes
Subjective:       History of Present Illness  Renee Christensen is a 32 y.o. female presenting after carpal, cubital, guyon release and neurolysis of her radial sensory nerve with wrap March 08, 2022.  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  Her sensitivity of the radial nerve has significantly improved compared to preop..  Otherwise, the patient reports the sensitivity over the radial nerve has been steadily improving.    Her husband is still recovering from shoulder surgery and she plans on to have surgery on her shoulder once he recovers.    She is here today for evaluation of right ulnar-sided wrist pain.  This is not the result of an injury, she noticed several weeks ago pain with many activities of daily living.  She works at [employer] and using some of the equipment or even just using her hands for cooking cleaning produces pain near her ulnar styloid which radiates up the wrist and sometimes down her finger.  Pain is aggravated with rotation of the wrist and supination or pushing on the wrist.  She has not tried splinting or formal therapy.    X-ray from January did not show any bony abnormality.  She has not had any corticosteroid injection or MRI.      ///Since her last visit the patient reports    Her cast was uncomfortable for the first several days and now has become loose and is here for      After cast was removed she is still reporting pretty similar pain in the ulnar side of her wrist             Review of Systems   Constitutional: Negative.    HENT: Negative.    Eyes: Negative.    Respiratory: Negative.    Cardiovascular: Negative.    Gastrointestinal: Negative.    Endocrine: Negative.    Genitourinary: Negative.    Musculoskeletal: Right wrist pain  Skin: Negative.    Allergic/Immunologic: Negative.    Neurological: Negative.    Hematological: Negative.    Psychiatric/Behavioral: Negative.          Objective:          albuterol 0.083% (PROVENTIL) 2.5 mg /3 mL (0.083 %) nebulizer solution     aspirin EC (ASPIR-LOW) 81 mg tablet Take one tablet by mouth daily.    blood-glucose sensor (GUARDIAN 4 GLUCOSE SENSOR) sensor device     celecoxib (CELEBREX) 100 mg capsule Take one capsule by mouth twice daily.    cetirizine (ZYRTEC) 10 mg tablet Take one tablet by mouth every morning.    citalopram (CELEXA) 20 mg tablet Take one tablet by mouth daily.    dapagliflozin propanediol (FARXIGA) 10 mg tablet Take  by mouth.    furosemide (LASIX) 20 mg tablet Please take 2 tablets daily for 2 days and then 1 tablets daily for 2 days.    gabapentin (NEURONTIN) 100 mg capsule Take one capsule by mouth three times daily.    HYDROcodone/acetaminophen (NORCO) 5/325 mg tablet     insulin aspart U-100 (NOVOLOG) 100 unit/mL injection Insulin pump; Medtronic 670G: 12A-2.2,7a-2, 3- 2.20, 9p-2.00 I:C 12a-6.2,5a-6.2,11a-6.2,5p-6.2, 9p-6.2 SF 30 Target 120 AIT 2hrs TDD 75 U    lisinopriL (ZESTRIL) 2.5 mg tablet     loratadine (CLARITIN)  10 mg tablet Take one tablet by mouth daily.    metoprolol tartrate 25 mg tablet Take  by mouth.    omeprazole DR (PRILOSEC) 40 mg capsule Take one capsule by mouth daily before breakfast.    oxyCODONE (ROXICODONE) 5 mg tablet Take one tablet by mouth every 6 hours as needed for Pain.    prasugreL (EFFIENT) 10 mg tablet Take  by mouth.    rosuvastatin (CRESTOR) 40 mg tablet Take  by mouth.    SUMAtriptan succinate (IMITREX) 100 mg tablet Take one tablet by mouth as Needed.     Vitals:    07/01/23 1004   BP: 124/61   Pulse: 83   Weight: 93.4 kg (206 lb)   Height: 162.6 cm (5' 4)     Body mass index is 35.36 kg/m?Marland Kitchen     Physical Exam  alert, oriented  Breathing unlabored  RR  RUE:  INcisions in the elbow, wrist dorsally and volarly well approximated without surrounding erythema or drainage.  Scar on the dorsum of her right wrist has begun to soften and swelling is mild.  Constitutional: Well developed, well nourished, in no acute distress  Psychological: normal affect, mood    HEENT: normocephalic, atraumatic, anicteric  Neck: supple, midline trachea, normal ROM   Respiratory: Normal effort, no respiratory distress, no cyanosis  Cardiovascular: visible extremities are warm and well perfused      Left UPPER EXTREMITY   Observation: Wrist incision well approximated with sutures in place, no surrounding erythema or drainage  Left elbow incision well approximated with hyperemic scar, small area of separation with a scab in place.  No drainage.  Neurovascular: Normal sensibility to light touch in the ulnar, radial and median nerve distributions, fingers well perfused with normal capillary refill. 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      RIGHT UPPER EXTREMITY ULNAR SIDED PAIN  Skin: intact, normal color and temperature   Observation: no edema of right dorsal wrist. no ECU subluxation.  Tenderness:    Snuffbox: none on right, non on left    Scapholunate interval: none on right, none on left    Lunotriquetral interval: none on right, none on left    ECU: yes on right, none on left    DRUJ: none on right, none on left    Fovea/TFCC: yes on right, none on left    Press test: negative on right, negative on left  Vascular: 2+ radial pulses bilaterally. Hand and fingers are warm, well perfused with normal capillary refill.   Musculoskeletal: Forearm and hand compartments are soft, no pain with passive motion of the fingers.   WRIST Range of Motion (right / left) (in degrees):  wnl and symmetric  Watson's Scaphoid Shift: neg on right, neg on left  LT Ballottement:neg on right, neg on left  Piano key:neg on right, neg on left  DRUJ: neg on right, neg on left  Table top press test: neg on right, neg on left      Neuro: Intact sensibility to light touch in the ulnar, radial and median nerve distributions, including dorsal ulnar sensory branch        Assessment and Plan:  Milena D Lisenby is a 32 y.o. female s/p carpal, cubital and guyons release as well as radial  nerve neurolysis and wrap overall doing well.  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 Treatment options include splint or cast immobilization, occupational therapy, steroid injection, and arthroscopic or open surgery. A wrist arthroscopy would help with diagnosis by directly visualizing the TFCC, ligaments, and carpal bones. Arthroscopy could also potentially be therapeutic as a debridement and possible repair could also be done at that time.      However, given that the patient's symptoms have improved and the acute presentation, I would recommend a trial of steroid injection including over the ECU.  Unfortunately this did not improve her symptoms significantly.  For this reason we discussed the neck step would be immobilization followed by a course of occupational therapy, prior to considering more invasive options.  The patient is agreeable to this plan. She should avoid sports and heavy lifting with the left wrist to allow for this to heal.     The reason to proceed with arthroscopic surgery would be to better assess the ulnar side of the wrist and to improve her symptoms of pain.  Her exam is more consistent with ECU pathology rather than TFC.  risks of surgery include, but are not limited to, bleeding, infection, scar, nerve injury (ie dorsal ulnar sensory nerve) or injury to other adjacent structures & failure to improve. The rationale and alternatives were discussed, and she indicated understanding. We agree that at this time we will NOT pursue surgery.      We will schedule the patient an appointment to be placed in a [place] style cast and follow-up in 5 weeks.    Cast was uncomfortable and now loose so she came in today for evaluation and new cast placement.        Gracy Bruins., MD  07/01/23 10:22 AM

## 2023-07-22 ENCOUNTER — Encounter: Admit: 2023-07-22 | Discharge: 2023-07-22 | Payer: BC Managed Care – PPO

## 2023-07-22 ENCOUNTER — Ambulatory Visit: Admit: 2023-07-22 | Discharge: 2023-07-22 | Payer: BC Managed Care – PPO

## 2023-07-22 DIAGNOSIS — F32A Depression: Secondary | ICD-10-CM

## 2023-07-22 DIAGNOSIS — E119 Type 2 diabetes mellitus without complications: Secondary | ICD-10-CM

## 2023-07-22 DIAGNOSIS — I219 Acute myocardial infarction, unspecified: Secondary | ICD-10-CM

## 2023-07-22 DIAGNOSIS — M25531 Pain in right wrist: Secondary | ICD-10-CM

## 2023-07-22 DIAGNOSIS — J189 Pneumonia, unspecified organism: Secondary | ICD-10-CM

## 2023-07-22 DIAGNOSIS — T7840XA Allergy, unspecified, initial encounter: Secondary | ICD-10-CM

## 2023-07-22 DIAGNOSIS — F419 Anxiety disorder, unspecified: Secondary | ICD-10-CM

## 2023-07-22 NOTE — Progress Notes
Subjective:       History of Present Illness  Renee Christensen is a 32 y.o. female presenting after carpal, cubital, guyon release and neurolysis of her radial sensory nerve with wrap March 08, 2022.  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  Her sensitivity of the radial nerve has significantly improved compared to preop..  Otherwise, the patient reports the sensitivity over the radial nerve has been steadily improving.    Her husband is still recovering from shoulder surgery and she plans on to have surgery on her shoulder once he recovers.    She is here today for evaluation of right ulnar-sided wrist pain.  This is not the result of an injury, she noticed several weeks ago pain with many activities of daily living.  She works at [employer] and using some of the equipment or even just using her hands for cooking cleaning produces pain near her ulnar styloid which radiates up the wrist and sometimes down her finger.  Pain is aggravated with rotation of the wrist and supination or pushing on the wrist.  She has not tried splinting or formal therapy.    X-ray from January did not show any bony abnormality.  This steroid injection did not provide durable result      ///Since her last visit the patient reports    She presents today after cast treatment, after cast was removed she is still reporting pretty similar pain in the ulnar side of her wrist         Review of Systems   Constitutional: Negative.    HENT: Negative.    Eyes: Negative.    Respiratory: Negative.    Cardiovascular: Negative.    Gastrointestinal: Negative.    Endocrine: Negative.    Genitourinary: Negative.    Musculoskeletal: Right wrist pain  Skin: Negative.    Allergic/Immunologic: Negative.    Neurological: Negative.    Hematological: Negative.    Psychiatric/Behavioral: Negative.          Objective:          albuterol 0.083% (PROVENTIL) 2.5 mg /3 mL (0.083 %) nebulizer solution     aspirin EC (ASPIR-LOW) 81 mg tablet Take one tablet by mouth daily.    blood-glucose sensor (GUARDIAN 4 GLUCOSE SENSOR) sensor device     celecoxib (CELEBREX) 100 mg capsule Take one capsule by mouth twice daily.    cetirizine (ZYRTEC) 10 mg tablet Take one tablet by mouth every morning.    citalopram (CELEXA) 20 mg tablet Take one tablet by mouth daily.    dapagliflozin propanediol (FARXIGA) 10 mg tablet Take  by mouth.    furosemide (LASIX) 20 mg tablet Please take 2 tablets daily for 2 days and then 1 tablets daily for 2 days.    gabapentin (NEURONTIN) 100 mg capsule Take one capsule by mouth three times daily.    HYDROcodone/acetaminophen (NORCO) 5/325 mg tablet     insulin aspart U-100 (NOVOLOG) 100 unit/mL injection Insulin pump; Medtronic 670G: 12A-2.2,7a-2, 3- 2.20, 9p-2.00 I:C 12a-6.2,5a-6.2,11a-6.2,5p-6.2, 9p-6.2 SF 30 Target 120 AIT 2hrs TDD 75 U    lisinopriL (ZESTRIL) 2.5 mg tablet     loratadine (CLARITIN) 10 mg tablet Take one tablet by mouth daily.    metoprolol tartrate 25 mg tablet Take  by mouth.  omeprazole DR (PRILOSEC) 40 mg capsule Take one capsule by mouth daily before breakfast.    oxyCODONE (ROXICODONE) 5 mg tablet Take one tablet by mouth every 6 hours as needed for Pain.    prasugreL (EFFIENT) 10 mg tablet Take  by mouth.    rosuvastatin (CRESTOR) 40 mg tablet Take  by mouth.    SUMAtriptan succinate (IMITREX) 100 mg tablet Take one tablet by mouth as Needed.     Vitals:    07/22/23 1546   BP: 110/57   Pulse: 81   Weight: 93.4 kg (206 lb)   Height: 162.6 cm (5' 4)     Body mass index is 35.36 kg/m?Marland Kitchen     Physical Exam  alert, oriented  Breathing unlabored  RR  RUE:  INcisions in the elbow, wrist dorsally and volarly well approximated without surrounding erythema or drainage.  Scar on the dorsum of her right wrist has begun to soften and swelling is mild.  Constitutional: Well developed, well nourished, in no acute distress  Psychological: normal affect, mood    HEENT: normocephalic, atraumatic, anicteric  Neck: supple, midline trachea, normal ROM   Respiratory: Normal effort, no respiratory distress, no cyanosis  Cardiovascular: visible extremities are warm and well perfused      Left UPPER EXTREMITY   Observation: Wrist incision well approximated with sutures in place, no surrounding erythema or drainage  Left elbow incision well approximated with hyperemic scar, small area of separation with a scab in place.  No drainage.  Neurovascular: Normal sensibility to light touch in the ulnar, radial and median nerve distributions, fingers well perfused with normal capillary refill. 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      RIGHT UPPER EXTREMITY ULNAR SIDED PAIN  Skin: intact, normal color and temperature   Observation: no edema of right dorsal wrist. no ECU subluxation.  Tenderness:    Snuffbox: none on right, non on left    Scapholunate interval: none on right, none on left    Lunotriquetral interval: none on right, none on left    ECU: yes on right, none on left    DRUJ: none on right, none on left    Fovea/TFCC: yes on right, none on left    Press test: negative on right, negative on left  Vascular: 2+ radial pulses bilaterally. Hand and fingers are warm, well perfused with normal capillary refill.   Musculoskeletal: Forearm and hand compartments are soft, no pain with passive motion of the fingers.   WRIST Range of Motion (right / left) (in degrees):  wnl and symmetric  Watson's Scaphoid Shift: neg on right, neg on left  LT Ballottement:neg on right, neg on left  Piano key:neg on right, neg on left  DRUJ: neg on right, neg on left  Table top press test: neg on right, neg on left      Neuro: Intact sensibility to light touch in the ulnar, radial and median nerve distributions, including dorsal ulnar sensory branch        Assessment and Plan:  Renee Christensen is a 33 y.o. female s/p carpal, cubital and guyons release as well as radial nerve neurolysis and wrap overall doing well.  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 Treatment options include splint or cast immobilization, occupational therapy, steroid injection, and arthroscopic or open surgery. A wrist arthroscopy would help with diagnosis by directly visualizing the TFCC, ligaments, and carpal bones. Arthroscopy could also potentially be therapeutic as a debridement and possible repair could also be done at that time.      However, given that the patient's symptoms have improved and the acute presentation, I would recommend a trial of steroid injection including over the ECU.  Unfortunately this did not improve her symptoms significantly.  For this reason we discussed the neck step would be immobilization followed by a course of occupational therapy, prior to considering more invasive options.  The patient is agreeable to this plan. She should avoid sports and heavy lifting with the left wrist to allow for this to heal.     The reason to proceed with arthroscopic surgery would be to better assess the ulnar side of the wrist and to improve her symptoms of pain.  While initially her pathology seemed more like ECU tendinitis this part has resolved and now her symptoms are localizing more to the TFC.  risks of surgery include, but are not limited to, bleeding, infection, scar, nerve injury (ie dorsal ulnar sensory nerve) or injury to other adjacent structures & failure to improve. The rationale and alternatives were discussed, and she indicated understanding.  She would like to proceed with wrist arthroscopy.        We also discussed the role of PIN and AIN neurectomy for wrist denervation as an adjunct to this procedure.  Patient understands that this does not necessarily fix the underlying pathology but can help with her chronic wrist pain as well as her postoperative pain from arthroscopy.    Location: [place]  Operation: Right wrist arthroscopy,  wrist denervation, possible TFC repair, possible debridement  Codes: 45409, [id number] x2, possible [phone], possible [phone], possible [phone]  Anesthetic: Regional  Surgical time: 1.5 hours  Position: supine  Special Equipment/Implants: Wrist arthroscope and tower  Post op visit: 2 weeks  Patient Class: outpatient      Gracy Bruins., MD  07/22/23 4:18 PM

## 2023-07-23 ENCOUNTER — Ambulatory Visit: Admit: 2023-07-23 | Discharge: 2023-07-23 | Payer: BC Managed Care – PPO

## 2023-07-23 ENCOUNTER — Encounter: Admit: 2023-07-23 | Discharge: 2023-07-23 | Payer: BC Managed Care – PPO

## 2023-07-23 DIAGNOSIS — M25531 Pain in right wrist: Secondary | ICD-10-CM

## 2023-07-24 ENCOUNTER — Encounter: Admit: 2023-07-24 | Discharge: 2023-07-24 | Payer: BC Managed Care – PPO

## 2023-07-25 ENCOUNTER — Encounter: Admit: 2023-07-25 | Discharge: 2023-07-25 | Payer: BC Managed Care – PPO

## 2023-07-29 ENCOUNTER — Encounter: Admit: 2023-07-29 | Discharge: 2023-07-29 | Payer: BC Managed Care – PPO

## 2023-07-29 ENCOUNTER — Ambulatory Visit: Admit: 2023-07-29 | Discharge: 2023-07-29 | Payer: BC Managed Care – PPO

## 2023-07-29 DIAGNOSIS — D759 Disease of blood and blood-forming organs, unspecified: Secondary | ICD-10-CM

## 2023-07-29 DIAGNOSIS — I219 Acute myocardial infarction, unspecified: Secondary | ICD-10-CM

## 2023-07-29 DIAGNOSIS — I10 Essential (primary) hypertension: Secondary | ICD-10-CM

## 2023-07-29 DIAGNOSIS — F419 Anxiety disorder, unspecified: Secondary | ICD-10-CM

## 2023-07-29 DIAGNOSIS — K859 Acute pancreatitis without necrosis or infection, unspecified: Secondary | ICD-10-CM

## 2023-07-29 DIAGNOSIS — J189 Pneumonia, unspecified organism: Secondary | ICD-10-CM

## 2023-07-29 DIAGNOSIS — I499 Cardiac arrhythmia, unspecified: Secondary | ICD-10-CM

## 2023-07-29 DIAGNOSIS — E119 Type 2 diabetes mellitus without complications: Secondary | ICD-10-CM

## 2023-07-29 DIAGNOSIS — I2510 Atherosclerotic heart disease of native coronary artery without angina pectoris: Secondary | ICD-10-CM

## 2023-07-29 DIAGNOSIS — F17200 Nicotine dependence, unspecified, uncomplicated: Secondary | ICD-10-CM

## 2023-07-29 DIAGNOSIS — K219 Gastro-esophageal reflux disease without esophagitis: Secondary | ICD-10-CM

## 2023-07-29 DIAGNOSIS — F32A Depression: Secondary | ICD-10-CM

## 2023-07-29 DIAGNOSIS — E785 Hyperlipidemia, unspecified: Secondary | ICD-10-CM

## 2023-07-29 DIAGNOSIS — T7840XA Allergy, unspecified, initial encounter: Secondary | ICD-10-CM

## 2023-07-29 NOTE — Pre-Anesthesia Patient Instructions
PREPROCEDURE INFORMATION    Arrival at the hospital  [facility]  [address]  [place], [place] [address]    Park in the [employer], located directly across from the main entrance to the hospital.  Enter through the ground floor main hospital entrance and check in at the Information Desk in the lobby.  They will validate your parking ticket and direct you to the next location.  If you are a woman between the ages of 42 and 35, and have not had a hysterectomy, you will be asked for a urine sample prior to surgery.  Please do not urinate before arriving in the Surgery Waiting Room.  Once there, check in and let the attendant know if you need to provide a sample.    You will receive a call with your surgery arrival time between 2:30pm and 4:30pm the last business day before your procedure.  If you do not receive a call, please call [phone] before 4:30pm or [phone] after 4:30pm.    Eating or drinking before surgery  Nothing to eat after 11:00pm the night before your surgery including gum, mints and candy.  You may have clear liquids up to 2 hours before your surgery time.  Clear Liquid Examples include:  Water, Clear juice (apple or cranberry (no pulp or orange juice) - If diabetic blood sugar must be <200, Coffee and tea with or without sugar (no cream), Sports drink - Powerade/Gatorade, Soda, and Bowel Prep solutions only if ordered by surgeon.  If you have received specific instructions from your surgeon, please follow those.    Planning transportation for outpatient procedure  For your safety, you will need to arrange for a responsible ride/person to accompany you home due to sedation or anesthesia with your procedure.  An [employer], taxi or other public transportation driver is not considered a responsible person to accompany you home.    Bath/Shower Instructions  Take a bath or shower using the special soap given to you in PAC. Use half the bottle the night before, and the other half the morning of your procedure. Use clean towels with each bath or shower.  Put on clean clothes after bath or shower.  Avoid using lotion and oils.  If you are having surgery above the waist, wear a shirt that fastens up the front.  Sleep on clean sheets if bath or shower is done the night before procedure.    Morning of your procedure:  Brush your teeth and tongue  Do not smoke, vape, chew or user any tobacco products.  Do not shave the area where you will have surgery.  Remove nail polish, makeup and all jewelry (including piercings) before coming to the hospital.  Dress in clean, loose, comfortable clothing.    Valuables  Leave money, credit cards, jewelry, and any other valuables at home. If medications are being filled and picked up at a [facility] pharmacy, payment will be needed. The [facility] is not responsible for the loss or breakage of personal items.    What to bring to the hospital  ID/Insurance card  Medical Device card  Official documents for legal guardianship  Copy of your Living Will, Advanced Directives, and/or Durable Power of Attorney. If you have these documents, please bring them to the admissions office on the day of your surgery to be scanned into your records.  Do not bring medications from home unless instructed by a pharmacist.  CPAP/BiPAP machine (including all supplies)  Walker, cane,  or motorized scooter  Cases for glasses/hearing aids/contact lens (bring solutions for contacts)  Stimulator remote  Insulin pump and supplies as directed by pharmacy     Notify us at [facility]: [phone] on the day of your procedure if:  You need to cancel your procedure.  You are going to be late.    Notify your surgeon if:  There is a possibility that you are pregnant.  You become ill with a cough, fever, sore throat, nausea, vomiting or flu-like symptoms.  You have any open wounds/sores that are red, painful, draining, or are new since you last saw the doctor.  You need to cancel your procedure.    Preparing to get your medications at discharge  Your surgeon may prescribe you medications to take after your procedure.  If you like the convenience of having your medications filled here at [facility], please do the following:  Go to [facility] pharmacy after your [facility] appointment to put a credit card on file.  Call [facility] [place] pharmacy at [phone] (Monday-Friday 7am-9pm or Saturday and Sunday 9am-5pm) to put a credit card on file.  Bring a credit card or cash on the day of your procedure- please leave with a family member rather than bringing it into the preop area.    Current Visitor Policy:  Visitors must be free of fever and symptoms to be in our facilities.  No more than 2 visitors per patient are allowed.  Additional guidelines may vary, based on patient care area or patient's condition.  Patients in semiprivate rooms may have visitors, but visits should be coordinated so only two total visitors are in a room at a time due to space limitations.  Children younger than age 66 are allowed to visit inpatients.    Thank you for participating in your Preoperative Assessment visit today.    If you have any changes to your health or hospitalizations between now and your surgery, please call us at [phone].    Instructions given to patient via: MyChart, verbal, and printed copy

## 2023-08-11 ENCOUNTER — Encounter: Admit: 2023-08-11 | Discharge: 2023-08-11 | Payer: BC Managed Care – PPO

## 2023-08-14 ENCOUNTER — Encounter: Admit: 2023-08-14 | Discharge: 2023-08-14 | Payer: BC Managed Care – PPO

## 2023-08-20 ENCOUNTER — Encounter: Admit: 2023-08-20 | Discharge: 2023-08-20 | Payer: BC Managed Care – PPO

## 2023-08-26 ENCOUNTER — Encounter: Admit: 2023-08-26 | Discharge: 2023-08-26 | Payer: BC Managed Care – PPO

## 2023-08-27 ENCOUNTER — Encounter: Admit: 2023-08-27 | Discharge: 2023-08-27 | Payer: BC Managed Care – PPO

## 2023-08-27 ENCOUNTER — Encounter: Admit: 2023-08-27 | Discharge: 2023-08-28 | Payer: BC Managed Care – PPO

## 2023-08-27 ENCOUNTER — Ambulatory Visit: Admit: 2023-08-27 | Discharge: 2023-08-27 | Payer: BC Managed Care – PPO

## 2023-08-27 MED FILL — OXYCODONE-ACETAMINOPHEN 5-325 MG PO TAB: 5-325 mg | ORAL | 3 days supply | Qty: 12 | Fill #1 | Status: CP

## 2023-09-09 ENCOUNTER — Encounter: Admit: 2023-09-09 | Discharge: 2023-09-09 | Payer: BC Managed Care – PPO

## 2023-09-09 ENCOUNTER — Ambulatory Visit: Admit: 2023-09-09 | Discharge: 2023-09-10 | Payer: BC Managed Care – PPO

## 2023-11-06 ENCOUNTER — Encounter: Admit: 2023-11-06 | Discharge: 2023-11-06 | Payer: BC Managed Care – PPO

## 2023-12-02 ENCOUNTER — Encounter: Admit: 2023-12-02 | Discharge: 2023-12-02 | Payer: BC Managed Care – PPO

## 2023-12-02 ENCOUNTER — Ambulatory Visit: Admit: 2023-12-02 | Discharge: 2023-12-03 | Payer: BC Managed Care – PPO

## 2023-12-10 ENCOUNTER — Encounter: Admit: 2023-12-10 | Discharge: 2023-12-10 | Payer: BC Managed Care – PPO

## 2024-03-09 ENCOUNTER — Encounter: Admit: 2024-03-09 | Discharge: 2024-03-09 | Payer: BLUE CROSS/BLUE SHIELD

## 2024-05-09 IMAGING — CR SHOULDCMRT
3 series · 3 of 3 positions shown · non-contrast
Comparison: none

[w shoulder external right]
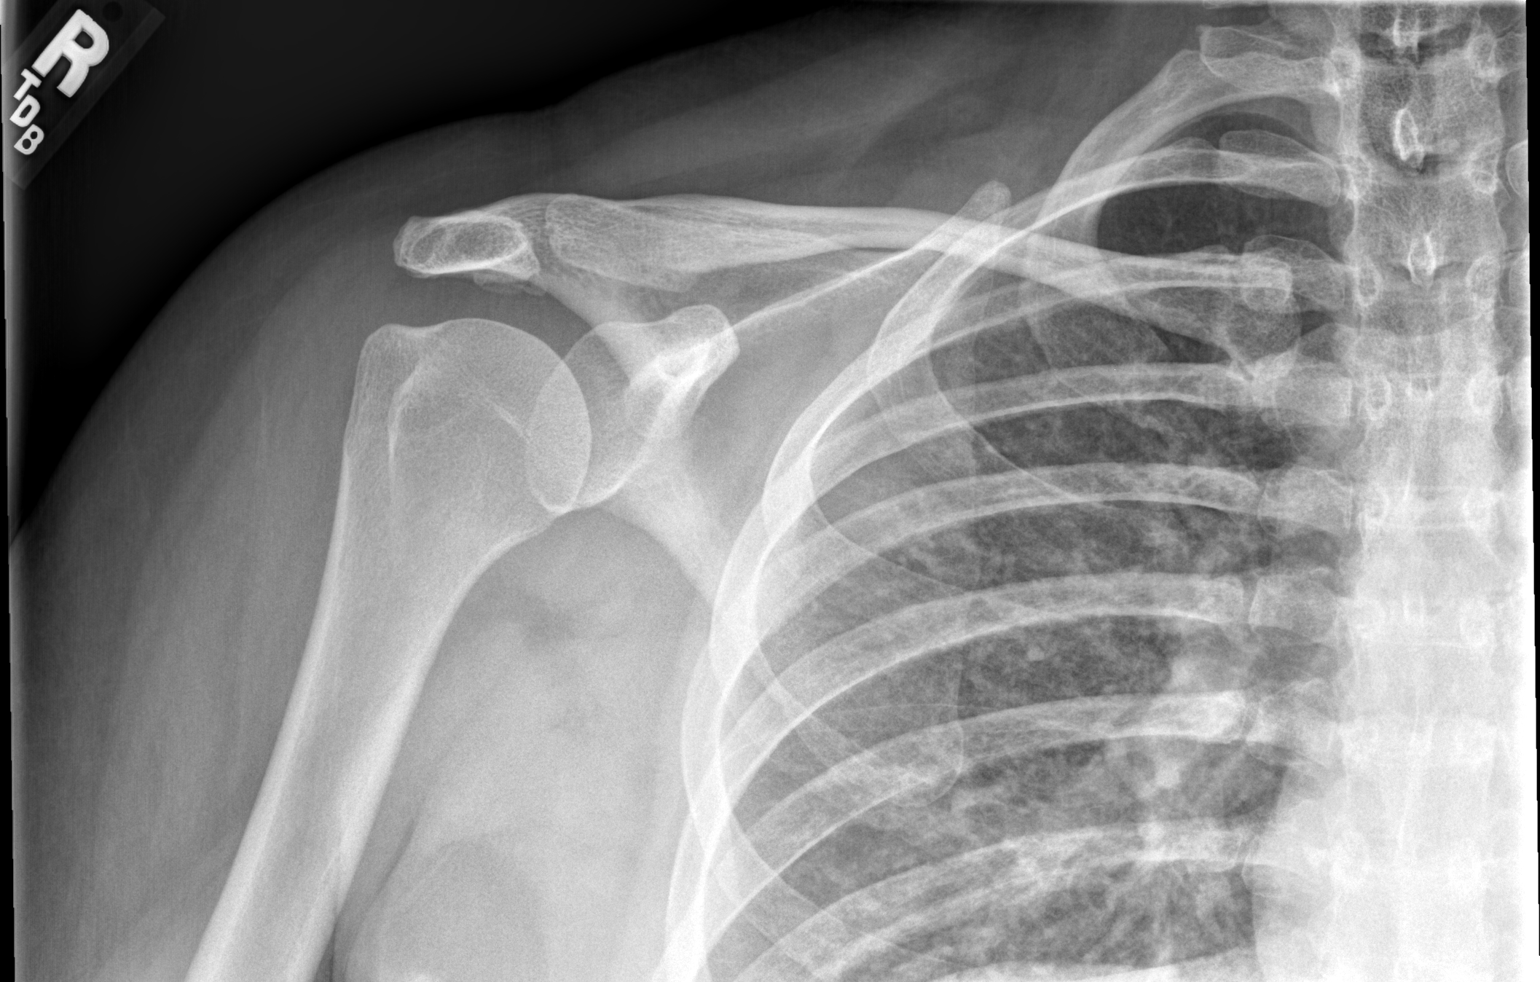

[w shoulder internal right]
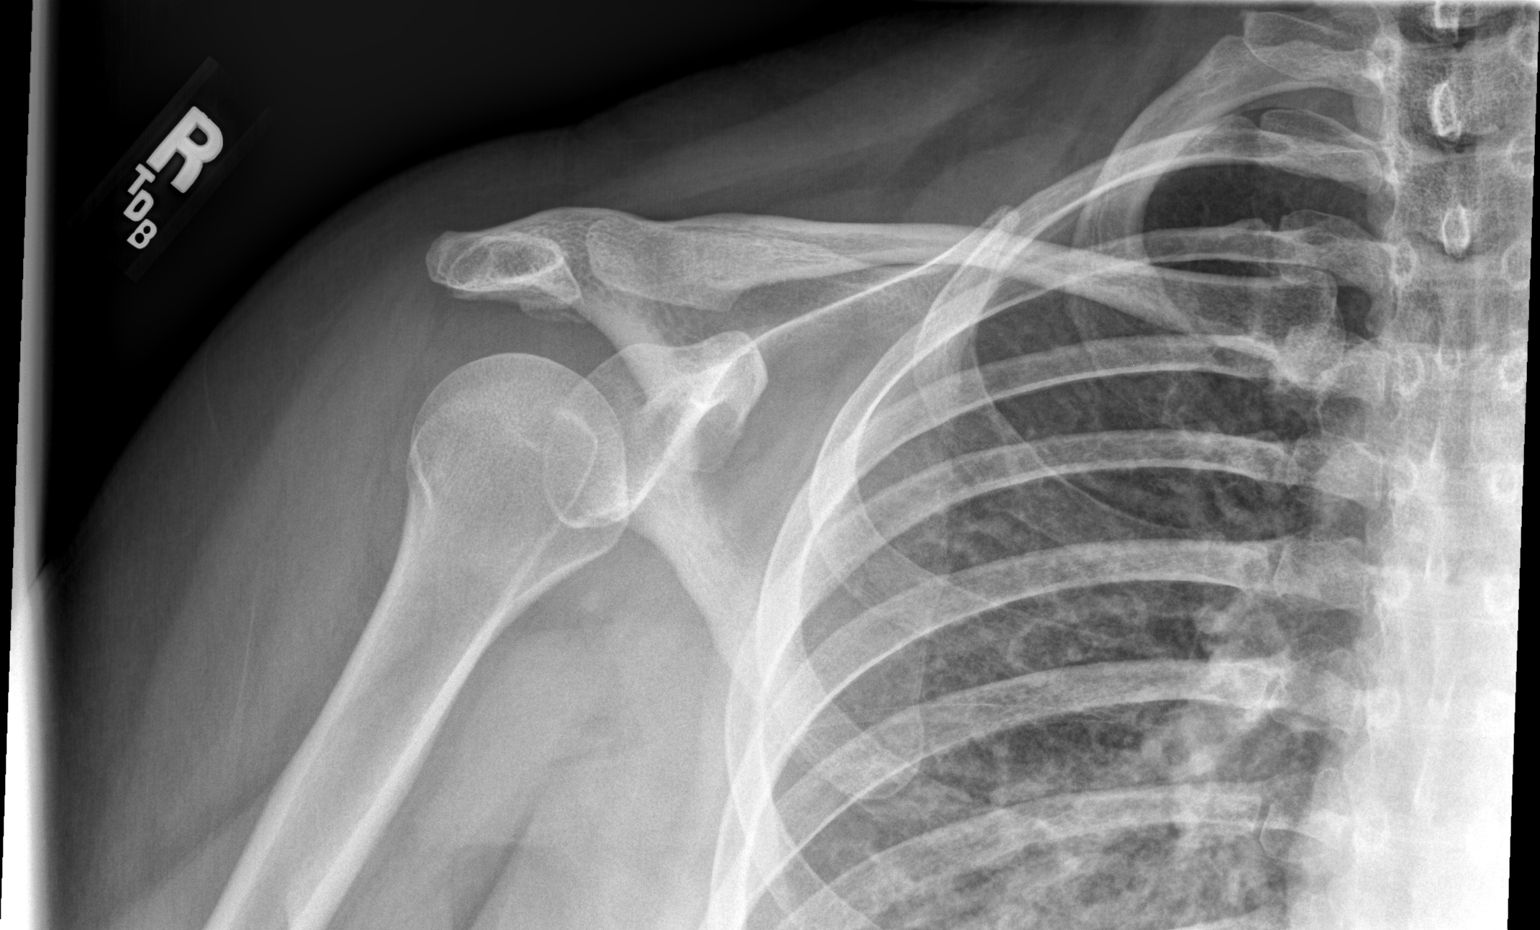

[w shoulder y-view ap right]
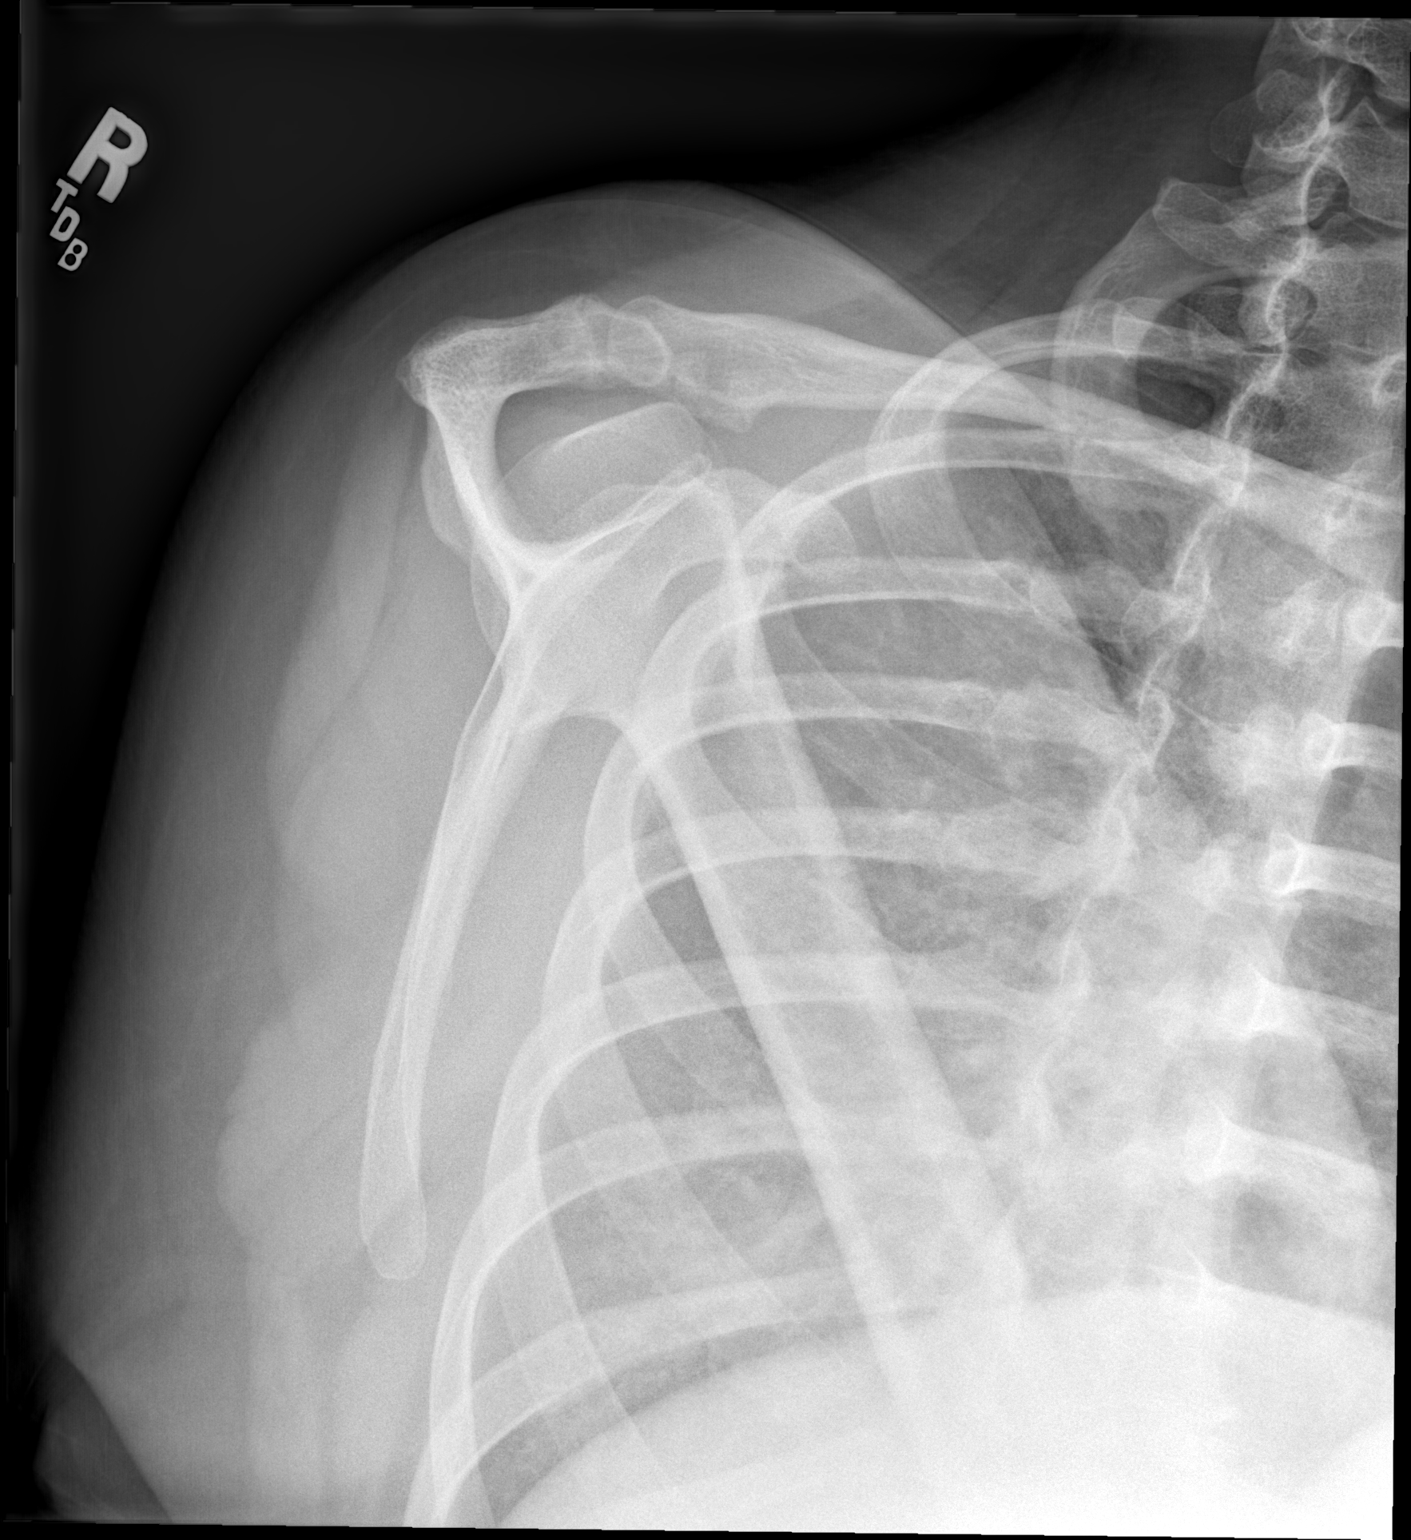

[3 of 3 positions shown; findings below may reference images not displayed]

DIAGNOSTIC STUDIES

EXAM

RADIOLOGICAL EXAMINATION SHOULDER; COMPLETE, MINIMUM 2 VIEWS CPT 74949

INDICATION

Pain.

TECHNIQUE

AP internal external and Y views of the right shoulder was obtained.

COMPARISONS

No priors available for comparison.

FINDINGS

No acute displaced fracture or dislocation. AC joint appears aligned. Adjacent ribs appear normal.
Soft tissues appear normal.

IMPRESSION

No definite acute fracture or dislocation.

Further evaluation with an MRI is suggested if the patient's symptoms persist and if clinically
indicated.

Tech Notes:

Right shoulder pain. No known injury. TB

## 2024-06-29 ENCOUNTER — Encounter: Admit: 2024-06-29 | Discharge: 2024-06-29 | Payer: BLUE CROSS/BLUE SHIELD

## 2024-06-29 ENCOUNTER — Ambulatory Visit: Admit: 2024-06-29 | Discharge: 2024-06-30 | Payer: BLUE CROSS/BLUE SHIELD

## 2024-07-04 ENCOUNTER — Encounter: Admit: 2024-07-04 | Discharge: 2024-07-04 | Payer: BLUE CROSS/BLUE SHIELD

## 2024-10-18 ENCOUNTER — Encounter: Admit: 2024-10-18 | Discharge: 2024-10-18 | Payer: BLUE CROSS/BLUE SHIELD
